# Patient Record
Sex: Female | Born: 1970 | Race: Black or African American | Hispanic: No | Marital: Single | Smoking: Never smoker
Health system: Southern US, Community
[De-identification: ages and names within clinical notes are randomized; demographics above are authoritative.]

## PROBLEM LIST (undated history)

## (undated) DIAGNOSIS — F329 Major depressive disorder, single episode, unspecified: Secondary | ICD-10-CM

## (undated) DIAGNOSIS — F32A Depression, unspecified: Secondary | ICD-10-CM

## (undated) DIAGNOSIS — D219 Benign neoplasm of connective and other soft tissue, unspecified: Secondary | ICD-10-CM

## (undated) HISTORY — DX: Benign neoplasm of connective and other soft tissue, unspecified: D21.9

## (undated) HISTORY — PX: PELVIC LAPAROSCOPY: SHX162

---

## 2003-06-13 ENCOUNTER — Encounter: Admission: RE | Admit: 2003-06-13 | Discharge: 2003-06-13 | Payer: Self-pay | Admitting: Family Medicine

## 2003-06-13 ENCOUNTER — Encounter: Payer: Self-pay | Admitting: Family Medicine

## 2003-09-03 ENCOUNTER — Ambulatory Visit (HOSPITAL_COMMUNITY): Admission: RE | Admit: 2003-09-03 | Discharge: 2003-09-03 | Payer: Self-pay | Admitting: *Deleted

## 2003-09-13 ENCOUNTER — Ambulatory Visit (HOSPITAL_COMMUNITY): Admission: RE | Admit: 2003-09-13 | Discharge: 2003-09-13 | Payer: Self-pay | Admitting: *Deleted

## 2003-09-30 HISTORY — PX: UTERINE ARTERY EMBOLIZATION: SHX2629

## 2003-10-09 ENCOUNTER — Observation Stay (HOSPITAL_COMMUNITY): Admission: RE | Admit: 2003-10-09 | Discharge: 2003-10-10 | Payer: Self-pay | Admitting: Diagnostic Radiology

## 2006-05-03 ENCOUNTER — Other Ambulatory Visit: Admission: RE | Admit: 2006-05-03 | Discharge: 2006-05-03 | Payer: Self-pay | Admitting: Family Medicine

## 2006-10-29 HISTORY — PX: ACHILLES TENDON REPAIR: SUR1153

## 2008-10-30 ENCOUNTER — Other Ambulatory Visit: Admission: RE | Admit: 2008-10-30 | Discharge: 2008-10-30 | Payer: Self-pay | Admitting: Obstetrics and Gynecology

## 2009-09-30 ENCOUNTER — Ambulatory Visit (HOSPITAL_COMMUNITY): Admission: RE | Admit: 2009-09-30 | Discharge: 2009-09-30 | Payer: Self-pay | Admitting: Obstetrics and Gynecology

## 2010-12-19 ENCOUNTER — Encounter: Payer: Self-pay | Admitting: Family Medicine

## 2010-12-19 ENCOUNTER — Encounter: Payer: Self-pay | Admitting: Diagnostic Radiology

## 2010-12-20 ENCOUNTER — Encounter: Payer: Self-pay | Admitting: Diagnostic Radiology

## 2011-01-19 HISTORY — PX: LAPAROSCOPIC ASSISTED VAGINAL HYSTERECTOMY: SHX5398

## 2013-01-05 ENCOUNTER — Emergency Department (HOSPITAL_COMMUNITY): Payer: BC Managed Care – PPO

## 2013-01-05 ENCOUNTER — Emergency Department (HOSPITAL_COMMUNITY)
Admission: EM | Admit: 2013-01-05 | Discharge: 2013-01-05 | Disposition: A | Discharge status: Home / Self Care | Payer: BC Managed Care – PPO | Attending: Emergency Medicine | Admitting: Emergency Medicine

## 2013-01-05 ENCOUNTER — Encounter (HOSPITAL_COMMUNITY): Payer: Self-pay | Admitting: *Deleted

## 2013-01-05 DIAGNOSIS — Z79899 Other long term (current) drug therapy: Secondary | ICD-10-CM | POA: Insufficient documentation

## 2013-01-05 DIAGNOSIS — R109 Unspecified abdominal pain: Secondary | ICD-10-CM

## 2013-01-05 DIAGNOSIS — M545 Low back pain, unspecified: Secondary | ICD-10-CM | POA: Insufficient documentation

## 2013-01-05 DIAGNOSIS — R1031 Right lower quadrant pain: Secondary | ICD-10-CM | POA: Insufficient documentation

## 2013-01-05 DIAGNOSIS — R300 Dysuria: Secondary | ICD-10-CM | POA: Insufficient documentation

## 2013-01-05 DIAGNOSIS — Z90710 Acquired absence of both cervix and uterus: Secondary | ICD-10-CM | POA: Insufficient documentation

## 2013-01-05 DIAGNOSIS — F17200 Nicotine dependence, unspecified, uncomplicated: Secondary | ICD-10-CM | POA: Insufficient documentation

## 2013-01-05 LAB — CBC WITH DIFFERENTIAL/PLATELET
Basophils Absolute: 0 10*3/uL (ref 0.0–0.1)
Basophils Relative: 1 % (ref 0–1)
Eosinophils Absolute: 0.1 10*3/uL (ref 0.0–0.7)
Eosinophils Relative: 1 % (ref 0–5)
HCT: 40.1 % (ref 36.0–46.0)
Hemoglobin: 13.7 g/dL (ref 12.0–15.0)
Lymphocytes Relative: 15 % (ref 12–46)
Lymphs Abs: 1.1 10*3/uL (ref 0.7–4.0)
MCH: 25.6 pg — ABNORMAL LOW (ref 26.0–34.0)
MCHC: 34.2 g/dL (ref 30.0–36.0)
MCV: 75 fL — ABNORMAL LOW (ref 78.0–100.0)
Monocytes Absolute: 0.3 10*3/uL (ref 0.1–1.0)
Monocytes Relative: 4 % (ref 3–12)
Neutro Abs: 5.7 10*3/uL (ref 1.7–7.7)
Neutrophils Relative %: 80 % — ABNORMAL HIGH (ref 43–77)
Platelets: 269 10*3/uL (ref 150–400)
RBC: 5.35 MIL/uL — ABNORMAL HIGH (ref 3.87–5.11)
RDW: 15.6 % — ABNORMAL HIGH (ref 11.5–15.5)
WBC: 7.2 10*3/uL (ref 4.0–10.5)

## 2013-01-05 LAB — COMPREHENSIVE METABOLIC PANEL
ALT: 13 U/L (ref 0–35)
AST: 21 U/L (ref 0–37)
Albumin: 4 g/dL (ref 3.5–5.2)
Alkaline Phosphatase: 58 U/L (ref 39–117)
BUN: 17 mg/dL (ref 6–23)
CO2: 30 mEq/L (ref 19–32)
Calcium: 9.4 mg/dL (ref 8.4–10.5)
Chloride: 99 mEq/L (ref 96–112)
Creatinine, Ser: 0.94 mg/dL (ref 0.50–1.10)
GFR calc Af Amer: 86 mL/min — ABNORMAL LOW (ref >90)
GFR calc non Af Amer: 74 mL/min — ABNORMAL LOW (ref >90)
Glucose, Bld: 103 mg/dL — ABNORMAL HIGH (ref 70–99)
Potassium: 3.9 mEq/L (ref 3.5–5.1)
Sodium: 137 mEq/L (ref 135–145)
Total Bilirubin: 0.2 mg/dL — ABNORMAL LOW (ref 0.3–1.2)
Total Protein: 7.4 g/dL (ref 6.0–8.3)

## 2013-01-05 LAB — URINALYSIS, ROUTINE W REFLEX MICROSCOPIC
Bilirubin Urine: NEGATIVE
Glucose, UA: NEGATIVE mg/dL
Hgb urine dipstick: NEGATIVE
Ketones, ur: NEGATIVE mg/dL
Leukocytes, UA: NEGATIVE
Nitrite: NEGATIVE
Protein, ur: NEGATIVE mg/dL
Specific Gravity, Urine: 1.021 (ref 1.005–1.030)
Urobilinogen, UA: 0.2 mg/dL (ref 0.0–1.0)
pH: 5 (ref 5.0–8.0)

## 2013-01-05 MED ORDER — IOHEXOL 300 MG/ML  SOLN
50.0000 mL | Freq: Once | INTRAMUSCULAR | Status: AC | PRN
  Administered 2013-01-05: 50 mL via ORAL

## 2013-01-05 MED ORDER — IOHEXOL 300 MG/ML  SOLN
100.0000 mL | Freq: Once | INTRAMUSCULAR | Status: AC | PRN
  Administered 2013-01-05: 100 mL via INTRAVENOUS

## 2013-01-05 MED ORDER — NAPROXEN 500 MG PO TABS: 500.0000 mg | ORAL_TABLET | Freq: Two times a day (BID) | ORAL | Status: DC

## 2013-01-05 NOTE — ED Notes (Signed)
Pt returned from CT °

## 2013-01-05 NOTE — ED Notes (Signed)
MD at bedside. 

## 2013-01-05 NOTE — ED Provider Notes (Signed)
History     CSN: [id number]  Arrival date & time 01/05/13  1437   First MD Initiated Contact with Patient 01/05/13 1627      Chief Complaint  Patient presents with  . Abdominal Pain    (Consider location/radiation/quality/duration/timing/severity/associated sxs/prior treatment) HPI Comments: 42 year old female with no significant medical history, status post hysterectomy several years ago who presents with right lower quadrant pain. She states that her abdomen started hurting yesterday, it was gradual in onset, he does have some radiation to her lower back and is not associated with nausea vomiting fevers or chills. She does have mild difficulty urinating and has had fewer bowel movements but denies constipation or blood in the stools. The symptoms have been relatively persistent over the last 24 hours. She does note that she had similar symptoms one week ago which resolved spontaneously. She also denies hematuria  Patient is a 42 y.o. female presenting with abdominal pain. The history is provided by the patient.  Abdominal Pain The primary symptoms of the illness include abdominal pain.    History reviewed. No pertinent past medical history.  Past Surgical History  Procedure Date  . Abdominal hysterectomy   . Achilles tendon repair   . Uterine artery embolization     No family history on file.  History  Substance Use Topics  . Smoking status: Current Every Day Smoker    Types: Cigarettes  . Smokeless tobacco: Not on file  . Alcohol Use: Yes     Comment: occasionally    OB History    Grav Para Term Preterm Abortions TAB SAB Ect Mult Living                  Review of Systems  Gastrointestinal: Positive for abdominal pain.  All other systems reviewed and are negative.    Allergies  Review of patient's allergies indicates no known allergies.  Home Medications   Current Outpatient Rx  Name  Route  Sig  Dispense  Refill  . BUPROPION HCL ER (XL) 300 MG PO TB24    Oral   Take 300 mg by mouth daily.         . OMEGA-3 FATTY ACIDS 1000 MG PO CAPS   Oral   Take 1 g by mouth 3 (three) times daily.         Marland Kitchen NAPROXEN 500 MG PO TABS   Oral   Take 1 tablet (500 mg total) by mouth 2 (two) times daily with a meal.   30 tablet   0     BP 108/62  Pulse 65  Temp 98.3 F (36.8 C) (Oral)  Resp 16  Ht 5\' 2"  (1.575 m)  Wt 145 lb (65.772 kg)  BMI 26.52 kg/m2  SpO2 100%  Physical Exam  Nursing note and vitals reviewed. Constitutional: She appears well-developed and well-nourished. No distress.  HENT:  Head: Normocephalic and atraumatic.  Mouth/Throat: Oropharynx is clear and moist. No oropharyngeal exudate.  Eyes: Conjunctivae normal and EOM are normal. Pupils are equal, round, and reactive to light. Right eye exhibits no discharge. Left eye exhibits no discharge. No scleral icterus.  Neck: Normal range of motion. Neck supple. No JVD present. No thyromegaly present.  Cardiovascular: Normal rate, regular rhythm, normal heart sounds and intact distal pulses.  Exam reveals no gallop and no friction rub.   No murmur heard. Pulmonary/Chest: Effort normal and breath sounds normal. No respiratory distress. She has no wheezes. She has no rales.  Abdominal: Soft. Bowel sounds  are normal. She exhibits no distension and no mass. There is tenderness ( Mild tenderness in the right lower quadrant, no Rovsing sign, no obturator sign, no psoas sign).  Musculoskeletal: Normal range of motion. She exhibits no edema and no tenderness.  Lymphadenopathy:    She has no cervical adenopathy.  Neurological: She is alert. Coordination normal.  Skin: Skin is warm and dry. No rash noted. No erythema.  Psychiatric: She has a normal mood and affect. Her behavior is normal.    ED Course  Procedures (including critical care time)  Labs Reviewed  CBC WITH DIFFERENTIAL - Abnormal; Notable for the following:    RBC 5.35 (*)     MCV 75.0 (*)     MCH 25.6 (*)     RDW 15.6  (*)     Neutrophils Relative 80 (*)     All other components within normal limits  COMPREHENSIVE METABOLIC PANEL - Abnormal; Notable for the following:    Glucose, Bld 103 (*)     Total Bilirubin 0.2 (*)     GFR calc non Af Amer 74 (*)     GFR calc Af Amer 86 (*)     All other components within normal limits  URINALYSIS, ROUTINE W REFLEX MICROSCOPIC   Ct Abdomen Pelvis W Contrast  01/05/2013  *RADIOLOGY REPORT*  Clinical Data: Right lower quadrant abdominal pain, nausea  CT ABDOMEN AND PELVIS WITH CONTRAST  Technique:  Multidetector CT imaging of the abdomen and pelvis was performed following the standard protocol during bolus administration of intravenous contrast.  Contrast: OMNIPAQUE IOHEXOL 300 MG/ML  SOLN  Comparison: None.  Findings: The lung bases are clear.  The liver enhances with two small subcentimeter low attenuation structures in the posterior right lower consistent with benign process such as cysts.  The gallbladder is contracted but no calcified gallstones are seen. The pancreas is normal in size and the pancreatic duct is not dilated.  The kidneys enhance with no calculus or solid mass, and there is a cyst in the mid right kidney of approximately 2.8 cm in diameter.  On delayed images the pelvocaliceal systems are unremarkable in the proximal ureters are normal in caliber.  The abdominal aorta is normal in caliber.  No adenopathy is seen.  The appendix is not optimally visualized, but the portion of the appendix that is visualized appears to fill with air.  No inflammatory process is seen within the right lower quadrant. However, there is a moderate amount of fluid in the cul-de-sac. Most likely this is due to a ruptured ovarian cyst with multiple follicles noted on the right.  A small follicle is noted anteriorly in the left ovary.  The urinary bladder is unremarkable.  The uterus has previously been resected. No acute skeletal abnormality is seen.  IMPRESSION:  1.  The appendix is  not well seen, but there is no evidence of an inflammatory process within the right lower quadrant. 2.  Moderate amount of free fluid within the cul-de-sac most likely due to a recent ruptured ovarian cyst.   Original Report Authenticated By: Dwyane Dee, M.D.      1. Abdominal pain       MDM  The patient has minimal tenderness, she declines pain medications at this time, she has vital signs which are very reassuring. We'll obtain a CT scan to rule out appendicitis thought also consider ovarian cysts or kidney stone in the differential diagnosis. Urinalysis is negative for hematuria making kidney stones less likely.  The urinalysis is also negative for infection.   CT scan shows some fluid in the pelvis, no signs of appendicitis, the patient appears stable and has a soft minimally tender abdomen.  She has been given indications for return and has expressed her understanding. She will be discharged with Naprosyn.  No leukocytosis     Vida Roller, MD 01/05/13 [place]

## 2013-01-05 NOTE — ED Notes (Signed)
Pt c/o some difficulty urinating and fewer bowel movements for the past few days.  Yesterday she noticed RLQ pain that ebbs and flows. Pain is sharp. Pt still has appendix.

## 2013-01-05 NOTE — ED Notes (Signed)
Pt ambulatory back to room.  Pt undressed into gown and ready to be seen by provider

## 2014-01-02 ENCOUNTER — Other Ambulatory Visit: Payer: Self-pay

## 2014-01-02 DIAGNOSIS — Z1231 Encounter for screening mammogram for malignant neoplasm of breast: Secondary | ICD-10-CM

## 2014-01-07 ENCOUNTER — Ambulatory Visit
Admission: RE | Admit: 2014-01-07 | Discharge: 2014-01-07 | Disposition: A | Discharge status: Home / Self Care | Payer: BC Managed Care – PPO | Source: Ambulatory Visit

## 2014-01-07 DIAGNOSIS — Z1231 Encounter for screening mammogram for malignant neoplasm of breast: Secondary | ICD-10-CM

## 2014-01-11 ENCOUNTER — Other Ambulatory Visit: Payer: Self-pay | Admitting: Family Medicine

## 2014-01-11 DIAGNOSIS — R928 Other abnormal and inconclusive findings on diagnostic imaging of breast: Secondary | ICD-10-CM

## 2014-01-30 ENCOUNTER — Ambulatory Visit
Admission: RE | Admit: 2014-01-30 | Discharge: 2014-01-30 | Disposition: A | Discharge status: Home / Self Care | Payer: BC Managed Care – PPO | Source: Ambulatory Visit | Attending: Family Medicine | Admitting: Family Medicine

## 2014-01-30 DIAGNOSIS — R928 Other abnormal and inconclusive findings on diagnostic imaging of breast: Secondary | ICD-10-CM

## 2014-02-27 ENCOUNTER — Other Ambulatory Visit: Payer: Self-pay | Admitting: Family Medicine

## 2014-02-27 ENCOUNTER — Ambulatory Visit
Admission: RE | Admit: 2014-02-27 | Discharge: 2014-02-27 | Disposition: A | Discharge status: Home / Self Care | Payer: BC Managed Care – PPO | Source: Ambulatory Visit | Attending: Family Medicine | Admitting: Family Medicine

## 2014-02-27 DIAGNOSIS — M79673 Pain in unspecified foot: Secondary | ICD-10-CM

## 2014-12-02 ENCOUNTER — Ambulatory Visit (INDEPENDENT_AMBULATORY_CARE_PROVIDER_SITE_OTHER): Payer: BC Managed Care – PPO | Admitting: Nurse Practitioner

## 2014-12-02 ENCOUNTER — Encounter: Payer: Self-pay | Admitting: Nurse Practitioner

## 2014-12-02 VITALS — BP 120/76 | HR 56 | Ht 62.0 in | Wt 148.0 lb

## 2014-12-02 DIAGNOSIS — Z01419 Encounter for gynecological examination (general) (routine) without abnormal findings: Secondary | ICD-10-CM

## 2014-12-02 DIAGNOSIS — Z113 Encounter for screening for infections with a predominantly sexual mode of transmission: Secondary | ICD-10-CM

## 2014-12-02 DIAGNOSIS — Z0000 Encounter for general adult medical examination without abnormal findings: Secondary | ICD-10-CM

## 2014-12-02 DIAGNOSIS — N951 Menopausal and female climacteric states: Secondary | ICD-10-CM

## 2014-12-02 LAB — POCT URINALYSIS DIPSTICK
Bilirubin, UA: NEGATIVE
Blood, UA: NEGATIVE
Glucose, UA: NEGATIVE
Ketones, UA: NEGATIVE
Leukocytes, UA: NEGATIVE
Nitrite, UA: NEGATIVE
Protein, UA: NEGATIVE
Urobilinogen, UA: NEGATIVE
pH, UA: 6

## 2014-12-02 NOTE — Progress Notes (Signed)
Patient ID: Virginia Hull, female   DOB: 1971/09/07, 44 y.o.   MRN: [id number] 43 y.o. G1P0010 Single African American Fe here for NGYN  exam. Former patient here but not seen since 04/2011.  Chart still available.  Not SA 6 X months.  Not tested for STD's since ending relationship at that time. Sleep interruption secondary to hot flashes since school started in August.  Does not feel this is entirely related to stress. Some decrease in libido and vaginal dryness. Recent TSH was done at PCP.  Patient's last menstrual period was 12/30/2010 (approximate).          Sexually active: No.  The current method of family planning is abstinence and status post hysterectomy.    Exercising: Yes.    Home exercise routine includes running. Smoker:  no  Health Maintenance: Pap:  10/30/10, Negative MMG:   01/07/14 with diagnostic MMG and ultrasound, right breast; asymmetrical fibroglandular tissue, repeat in 1 year TDaP:  ? Labs:  HB:  13.9  Urine:  Negative    reports that she has never smoked. She has never used smokeless tobacco. She reports that she drinks about 0.6 oz of alcohol per week. She reports that she does not use illicit drugs.  Past Medical History  Diagnosis Date  . Fibroid     Past Surgical History  Procedure Laterality Date  . Achilles tendon repair Right 10/2006  . Uterine artery embolization  09/2003  . Laparoscopic assisted vaginal hysterectomy  01/19/11    Current Outpatient Prescriptions  Medication Sig Dispense Refill  . buPROPion (WELLBUTRIN XL) 300 MG 24 hr tablet Take 300 mg by mouth daily.    . fish oil-omega-3 fatty acids 1000 MG capsule Take 1 g by mouth 3 (three) times daily.     No current facility-administered medications for this visit.    Family History  Problem Relation Age of Onset  . Hypertension Mother   . Diabetes Father   . Cancer Father 18    pancreatic  . Hypertension Sister   . Cancer Paternal Uncle     rare, unsure type  . Cancer Maternal  Grandmother     ? spinal    ROS:  Pertinent items are noted in HPI.  Otherwise, a comprehensive ROS was negative.  Exam:   BP 120/76 mmHg  Pulse 56  Ht 5\' 2"  (1.575 m)  Wt 148 lb (67.132 kg)  BMI 27.06 kg/m2  LMP 12/30/2010 (Approximate) Height: 5\' 2"  (157.5 cm)  Ht Readings from Last 3 Encounters:  12/02/14 5\' 2"  (1.575 m)  01/05/13 5\' 2"  (1.575 m)    General appearance: alert, cooperative and appears stated age Head: Normocephalic, without obvious abnormality, atraumatic Neck: no adenopathy, supple, symmetrical, trachea midline and thyroid normal to inspection and palpation Lungs: clear to auscultation bilaterally Breasts: normal appearance, no masses or tenderness, no mass Heart: regular rate and rhythm Abdomen: soft, non-tender; no masses,  no organomegaly Extremities: extremities normal, atraumatic, no cyanosis or edema Skin: Skin color, texture, turgor normal. No rashes or lesions Lymph nodes: Cervical, supraclavicular, and axillary nodes normal. No abnormal inguinal nodes palpated Neurologic: Grossly normal   Pelvic: External genitalia:  no lesions              Urethra:  normal appearing urethra with no masses, tenderness or lesions              Bartholin's and Skene's: normal  Vagina: normal appearing vagina with normal color and discharge, no lesions              Cervix: absent              Pap taken: No. Bimanual Exam:  Uterus:  uterus absent              Adnexa: no mass, fullness, tenderness               Rectovaginal: Confirms               Anus:  normal sphincter tone, no lesions  A:  Well Woman with normal exam  Perimenopausal - increase in vaso symptoms  S/P LAVH 12/2010 secondary to fibroids  R/O STD's  History of anxiety and depression on med's followed by PCP  P:   Reviewed health and wellness pertinent to exam  Pap smear not taken today  Mammogram is due 2/16 - history of abnormal area on the right  Follow with labs - discussed use  of ERT but would not start any treatment until newest mammogram is done and is normal.  Will then need a consult visit  Counseled on breast self exam, mammography screening, adequate intake of calcium and vitamin D, diet and exercise return annually or prn  An After Visit Summary was printed and given to the patient.

## 2014-12-02 NOTE — Patient Instructions (Addendum)

## 2014-12-03 LAB — FOLLICLE STIMULATING HORMONE: FSH: 7.4 m[IU]/mL

## 2014-12-03 LAB — STD PANEL
HIV 1&2 Ab, 4th Generation: NONREACTIVE
Hepatitis B Surface Ag: NEGATIVE

## 2014-12-03 LAB — HEMOGLOBIN, FINGERSTICK: Hemoglobin, fingerstick: 13.9 g/dL (ref 12.0–16.0)

## 2014-12-03 NOTE — Progress Notes (Signed)
Encounter reviewed by Dr. Brook Silva.  

## 2014-12-04 LAB — IPS N GONORRHOEA AND CHLAMYDIA BY PCR

## 2014-12-05 ENCOUNTER — Telehealth: Payer: Self-pay | Admitting: *Deleted

## 2014-12-05 NOTE — Telephone Encounter (Signed)
-----   Message from Milford Cage, [place] sent at 12/05/2014  8:41 AM EST ----- Let patient now that [facility] and chlamydia is negative

## 2014-12-05 NOTE — Telephone Encounter (Signed)
I have attempted to contact this patient by phone with the following results: left message to return call to [place] at [phone] on answering machine (mobile per [facility]).  No personal information given.  [phone] (Mobile)

## 2014-12-11 NOTE — Telephone Encounter (Signed)
Return call to Stephanie. °

## 2014-12-12 NOTE — Telephone Encounter (Signed)
I have attempted to contact this patient by phone with the following results: left message to return call to [place] at [phone] on answering machine (mobile per [facility]). No personal information given. [phone] (Mobile)

## 2014-12-19 NOTE — Telephone Encounter (Signed)
Notified pt of lab results.  Pt has questions regarding [place] and hot flashes. Pt states she is having hot flashes and sweating during the day.  Advised pt that last office states ERT may be an option after MMG.  MMG is due after 02/01/15.  Pt would like to know of any suggestions for OTC rememdies.  Please advise.

## 2014-12-23 NOTE — Telephone Encounter (Signed)
Patient is aware of [place] level at 7.4 which is not in menopausal range.  She may take OTC black cohosh an soy for vaso symptoms.  ie: Estroven or Remifemin.   She is S/P [facility] 2012.

## 2014-12-23 NOTE — Telephone Encounter (Signed)
I have attempted to contact this patient by phone with the following results: left message to return call to [place] at [phone] on answering machine (mobile per [facility]). No personal information given. [phone] (Mobile)

## 2015-04-23 NOTE — Telephone Encounter (Signed)
I have attempted to contact this patient by phone with the following results: left message to return call to [place] at [phone] on answering machine (mobile per [facility]). advised call was to follow up recent visit. [phone] (Mobile)

## 2015-04-29 ENCOUNTER — Encounter: Payer: Self-pay | Admitting: *Deleted

## 2015-04-29 NOTE — Telephone Encounter (Signed)
Letter printed and mailed.  

## 2015-04-29 NOTE — Telephone Encounter (Signed)
Would you send her a letter to call back.  We can give her test results and suggestions for menopausal symptoms at her return call.

## 2015-04-29 NOTE — Telephone Encounter (Signed)
I have attempted to call patient with recommendations, but patient has not returned call.  Please advise any further follow up.

## 2015-05-19 NOTE — Telephone Encounter (Signed)
Letter mailed on 04/29/15 with no return call from patient.  Pt was given lab results on 12/19/14, but had additional questions at that time. Please advise further follow up.  Thanks.

## 2015-05-19 NOTE — Telephone Encounter (Signed)
No further follow up is needed and she will call us if any questions of information is needed.

## 2015-05-20 NOTE — Telephone Encounter (Signed)
Closing encounter

## 2015-12-08 ENCOUNTER — Encounter: Payer: Self-pay | Admitting: Nurse Practitioner

## 2015-12-08 ENCOUNTER — Ambulatory Visit (INDEPENDENT_AMBULATORY_CARE_PROVIDER_SITE_OTHER): Payer: BLUE CROSS/BLUE SHIELD | Admitting: Nurse Practitioner

## 2015-12-08 VITALS — BP 110/72 | HR 64 | Ht 62.0 in | Wt 145.0 lb

## 2015-12-08 DIAGNOSIS — Z01419 Encounter for gynecological examination (general) (routine) without abnormal findings: Secondary | ICD-10-CM | POA: Diagnosis not present

## 2015-12-08 DIAGNOSIS — Z0000 Encounter for general adult medical examination without abnormal findings: Secondary | ICD-10-CM | POA: Diagnosis not present

## 2015-12-08 NOTE — Patient Instructions (Addendum)

## 2015-12-08 NOTE — Progress Notes (Signed)
Patient ID: Virginia Hull, female   DOB: 05-Feb-1971, 45 y.o.   MRN: [id number]  45 y.o. G1P0010 Single  African American Fe here for annual exam.  Not dating not SA for 1.5 years. No hot flashes.  Had right OV cyst 01/05/2013 and since then will have a pain on right side every 3-4 months.  This pain is not as bad, does produce bloating, some increase in PMS, and a dull achy pain that is resolved in 2-4 days.  Last episode of the pain was in December.  Patient's last menstrual period was 12/30/2010 (approximate).          Sexually active: No.  The current method of family planning is abstinence.    Exercising: Yes.    running, yoga, weight lifting Smoker:  no  Health Maintenance: Pap: 10/30/10, Negative (LAVH) MMG:  01/07/14 with diagnostic MMG and ultrasound, right breast; asymmetrical fibroglandular tissue, repeat in 1 year TDaP: ?? Will check with PCP Hep C and HIV: Hep C not indicated due to age; HIV completed 12/02/14 Labs: HB: 12.8  Urine: Negative    reports that she has never smoked. She has never used smokeless tobacco. She reports that she drinks about 0.6 oz of alcohol per week. She reports that she does not use illicit drugs.  Past Medical History  Diagnosis Date  . Fibroid     Past Surgical History  Procedure Laterality Date  . Achilles tendon repair Right 10/2006  . Uterine artery embolization  09/2003  . Laparoscopic assisted vaginal hysterectomy  01/19/11    Current Outpatient Prescriptions  Medication Sig Dispense Refill  . buPROPion (WELLBUTRIN XL) 300 MG 24 hr tablet Take 300 mg by mouth daily.    . fish oil-omega-3 fatty acids 1000 MG capsule Take 1 g by mouth 3 (three) times daily.    Marland Kitchen VYVANSE 40 MG capsule Take 40 mg by mouth every morning.  0   No current facility-administered medications for this visit.    Family History  Problem Relation Age of Onset  . Hypertension Mother   . Diabetes Father   . Pancreatic cancer Father 66    pancreatic  . Hypertension  Sister   . Cancer Paternal Uncle     rare, unsure type  . Cancer Maternal Grandmother     ? spinal  . Pancreatic cancer Other     paternal great uncle    ROS:  Pertinent items are noted in HPI.  Otherwise, a comprehensive ROS was negative.  Exam:   BP 110/72 mmHg  Pulse 64  Ht 5\' 2"  (1.575 m)  Wt 145 lb (65.772 kg)  BMI 26.51 kg/m2  LMP 12/30/2010 (Approximate) Height: 5\' 2"  (157.5 cm) Ht Readings from Last 3 Encounters:  12/08/15 5\' 2"  (1.575 m)  12/02/14 5\' 2"  (1.575 m)  01/05/13 5\' 2"  (1.575 m)    General appearance: alert, cooperative and appears stated age Head: Normocephalic, without obvious abnormality, atraumatic Neck: no adenopathy, supple, symmetrical, trachea midline and thyroid normal to inspection and palpation Lungs: clear to auscultation bilaterally Breasts: normal appearance, no masses or tenderness Heart: regular rate and rhythm Abdomen: soft, non-tender; no masses,  no organomegaly Extremities: extremities normal, atraumatic, no cyanosis or edema Skin: Skin color, texture, turgor normal. No rashes or lesions Lymph nodes: Cervical, supraclavicular, and axillary nodes normal. No abnormal inguinal nodes palpated Neurologic: Grossly normal   Pelvic: External genitalia:  no lesions              Urethra:  normal appearing urethra with no masses, tenderness or lesions              Bartholin's and Skene's: normal                 Vagina: normal appearing vagina with normal color and discharge, no lesions              Cervix: absent              Pap taken: No. Bimanual Exam:  Uterus:  uterus absent              Adnexa: no mass, fullness, tenderness               Rectovaginal: Confirms               Anus:  normal sphincter tone, no lesions  Chaperone present: no  A:  Well Woman with normal exam S/P LAVH 12/2010 secondary to fibroids History of anxiety and depression on med's followed by PCP   P:   Reviewed health and wellness  pertinent to exam  Pap smear as above  Mammogram is past due and will schedule - offered several times for [place] to make her apt. She declines.  Counseled on breast self exam, mammography screening, adequate intake of calcium and vitamin D, diet and exercise return annually or prn  An After Visit Summary was printed and given to the patient.

## 2015-12-09 LAB — POCT URINALYSIS DIPSTICK
Bilirubin, UA: NEGATIVE
Glucose, UA: NEGATIVE
KETONES UA: NEGATIVE
Leukocytes, UA: NEGATIVE
Nitrite, UA: NEGATIVE
PH UA: 6
PROTEIN UA: NEGATIVE
RBC UA: NEGATIVE
Urobilinogen, UA: NEGATIVE

## 2015-12-09 NOTE — Progress Notes (Signed)
Encounter reviewed Virginia Polinski, MD   

## 2015-12-15 LAB — HEMOGLOBIN, FINGERSTICK: Hemoglobin, fingerstick: 12.8 g/dL (ref 12.0–16.0)

## 2016-05-19 ENCOUNTER — Other Ambulatory Visit: Payer: Self-pay | Admitting: Nurse Practitioner

## 2016-05-19 ENCOUNTER — Other Ambulatory Visit: Payer: Self-pay | Admitting: Obstetrics & Gynecology

## 2016-05-19 DIAGNOSIS — Z1231 Encounter for screening mammogram for malignant neoplasm of breast: Secondary | ICD-10-CM

## 2016-06-02 ENCOUNTER — Ambulatory Visit
Admission: RE | Admit: 2016-06-02 | Discharge: 2016-06-02 | Disposition: A | Discharge status: Home / Self Care | Payer: BLUE CROSS/BLUE SHIELD | Source: Ambulatory Visit | Attending: Nurse Practitioner | Admitting: Nurse Practitioner

## 2016-06-02 DIAGNOSIS — Z1231 Encounter for screening mammogram for malignant neoplasm of breast: Secondary | ICD-10-CM

## 2016-07-21 ENCOUNTER — Encounter: Payer: Self-pay | Admitting: Obstetrics and Gynecology

## 2016-07-21 ENCOUNTER — Ambulatory Visit (INDEPENDENT_AMBULATORY_CARE_PROVIDER_SITE_OTHER): Payer: BLUE CROSS/BLUE SHIELD | Admitting: Obstetrics and Gynecology

## 2016-07-21 ENCOUNTER — Telehealth: Payer: Self-pay | Admitting: Nurse Practitioner

## 2016-07-21 VITALS — BP 100/70 | HR 60 | Temp 98.2°F | Resp 16 | Ht 62.0 in | Wt 143.0 lb

## 2016-07-21 DIAGNOSIS — R102 Pelvic and perineal pain: Secondary | ICD-10-CM

## 2016-07-21 DIAGNOSIS — N9489 Other specified conditions associated with female genital organs and menstrual cycle: Secondary | ICD-10-CM

## 2016-07-21 DIAGNOSIS — N949 Unspecified condition associated with female genital organs and menstrual cycle: Secondary | ICD-10-CM | POA: Diagnosis not present

## 2016-07-21 LAB — POCT URINALYSIS DIPSTICK
Bilirubin, UA: NEGATIVE
GLUCOSE UA: NEGATIVE
Ketones, UA: NEGATIVE
Leukocytes, UA: NEGATIVE
NITRITE UA: NEGATIVE
PROTEIN UA: NEGATIVE
RBC UA: NEGATIVE
UROBILINOGEN UA: NEGATIVE
pH, UA: 5

## 2016-07-21 NOTE — Telephone Encounter (Signed)
Spoke with patient. Patient states that for 3 weeks she has been experiencing constant dull pain with intermittent sharp shooting pain in her right pelvic region. States she woke up this morning and pain had worsened. Took Aleve at 4 am without relief. Unable to rate pain on a scale of 1-10. States she does not feel she needs to be seen in the ER. Denies any fever or chills. States she feels "drained and full." Patient has had a hysterectomy, but ovaries remain. Denies vaginal bleeding. Reports history of ovarian cyst. Advised she will need to be seen in the office for further evaluation. She is agreeable. Appointment scheduled for today 07/21/2016 at 4 pm with Dr.Jertson. She is agreeable to date and time.  Routing to provider for final review. Patient agreeable to disposition. Will close encounter.

## 2016-07-21 NOTE — Progress Notes (Signed)
GYNECOLOGY  VISIT   HPI: 45 y.o.   Single  African American  female   G1P0010 with Patient's last menstrual period was 12/30/2010 (approximate).   here for RLQ pain x 3-4 weeks. It is a constant dull pain, with intermittent sharp shooting components. Worse since this morning, woke her up. Today even the sharp pain has been constant. Aleve helps some. No fevers. Over the past month she has felt PMS symptoms, retaining water, swollen breast, night sweats. No nausea, emesis. She has slight constipation. No urinary c/o.  H/O LAVH, still has her ovaries Not sexually active.   GYNECOLOGIC HISTORY: Patient's last menstrual period was 12/30/2010 (approximate). Contraception: Hysterectomy  Menopausal hormone therapy: None        OB History    Gravida Para Term Preterm AB Living   1 0     1 0   SAB TAB Ectopic Multiple Live Births   1                 There are no active problems to display for this patient.   Past Medical History:  Diagnosis Date  . Fibroid     Past Surgical History:  Procedure Laterality Date  . ACHILLES TENDON REPAIR Right 10/2006  . LAPAROSCOPIC ASSISTED VAGINAL HYSTERECTOMY  01/19/11  . UTERINE ARTERY EMBOLIZATION  09/2003    Current Outpatient Prescriptions  Medication Sig Dispense Refill  . buPROPion (WELLBUTRIN XL) 300 MG 24 hr tablet Take 300 mg by mouth daily.    . fish oil-omega-3 fatty acids 1000 MG capsule Take 1 g by mouth 3 (three) times daily.    Marland Kitchen VYVANSE 60 MG capsule Take 1 capsule by mouth daily.  0   No current facility-administered medications for this visit.      ALLERGIES: Review of patient's allergies indicates no known allergies.  Family History  Problem Relation Age of Onset  . Hypertension Mother   . Diabetes Father   . Pancreatic cancer Father 19    pancreatic  . Hypertension Sister   . Cancer Paternal Uncle     rare, unsure type  . Cancer Maternal Grandmother     ? spinal  . Pancreatic cancer Other     paternal great uncle     Social History   Social History  . Marital status: Single    Spouse name: N/A  . Number of children: 0  . Years of education: N/A   Occupational History  . Not on file.   Social History Main Topics  . Smoking status: Never Smoker  . Smokeless tobacco: Never Used  . Alcohol use 0.6 oz/week    1 Standard drinks or equivalent per week     Comment: occasionally  . Drug use: No  . Sexual activity: Not Currently    Partners: Male    Birth control/ protection: Surgical   Other Topics Concern  . Not on file   Social History Narrative  . No narrative on file    Review of Systems  Constitutional: Negative.   HENT: Negative.   Eyes: Negative.   Respiratory: Negative.   Cardiovascular: Negative.   Gastrointestinal: Negative.   Genitourinary: Negative.   Musculoskeletal: Negative.   Skin: Negative.   Neurological: Negative.   Endo/Heme/Allergies: Negative.   Psychiatric/Behavioral: Negative.     PHYSICAL EXAMINATION:    BP 100/70 (BP Location: Right Arm, Patient Position: Sitting, Cuff Size: Normal)   Pulse 60   Temp 98.2 F (36.8 C) (Oral)   Resp 16  Ht 5\' 2"  (1.575 m)   Wt 143 lb (64.9 kg)   LMP 12/30/2010 (Approximate)   BMI 26.16 kg/m     General appearance: alert, cooperative and appears stated age Neck: no adenopathy, supple, symmetrical, trachea midline and thyroid normal to inspection and palpation  Abdomen: soft, mildly tender in the RLQ, no rebound, no guarding. Most tender in the area of her ascending colon.   Pelvic: External genitalia:  no lesions              Urethra:  normal appearing urethra with no masses, tenderness or lesions              Bartholins and Skenes: normal                 Vagina: normal appearing vagina with normal color and discharge, no lesions              Cervix: absent              Bimanual Exam:  Uterus:  uterus absent              Adnexa: tender, irregularly shaped mass at the vaginal apex. Very tender.                Rectovaginal: Yes.  .  Confirms.              Anus:  normal sphincter tone, no lesions  Chaperone was present for exam.  ASSESSMENT Pelvic pain Adnexal mass, tender H/O LAVH    PLAN Aleve and tylenol for pain She will return tomorrow for a gyn ultrasound, declines going to the ER If her pain worsens she needs to go to the ER   An After Visit Summary was printed and given to the patient.

## 2016-07-21 NOTE — Telephone Encounter (Signed)
Patient thinks she may have an ovarian cyst.

## 2016-07-22 ENCOUNTER — Other Ambulatory Visit: Payer: BLUE CROSS/BLUE SHIELD | Admitting: Obstetrics and Gynecology

## 2016-07-22 ENCOUNTER — Ambulatory Visit: Payer: BLUE CROSS/BLUE SHIELD

## 2016-07-22 DIAGNOSIS — R102 Pelvic and perineal pain unspecified side: Secondary | ICD-10-CM

## 2016-07-22 DIAGNOSIS — N9489 Other specified conditions associated with female genital organs and menstrual cycle: Secondary | ICD-10-CM

## 2016-07-28 ENCOUNTER — Ambulatory Visit (INDEPENDENT_AMBULATORY_CARE_PROVIDER_SITE_OTHER): Payer: BLUE CROSS/BLUE SHIELD | Admitting: Obstetrics and Gynecology

## 2016-07-28 ENCOUNTER — Encounter: Payer: Self-pay | Admitting: Obstetrics and Gynecology

## 2016-07-28 VITALS — BP 108/70 | HR 64 | Resp 12 | Wt 145.0 lb

## 2016-07-28 DIAGNOSIS — N949 Unspecified condition associated with female genital organs and menstrual cycle: Secondary | ICD-10-CM

## 2016-07-28 DIAGNOSIS — R102 Pelvic and perineal pain: Secondary | ICD-10-CM

## 2016-07-28 DIAGNOSIS — N9489 Other specified conditions associated with female genital organs and menstrual cycle: Secondary | ICD-10-CM

## 2016-07-28 NOTE — Progress Notes (Signed)
GYNECOLOGY  VISIT   HPI: 45 y.o.   Single  African American  female   G1P0010 with Patient's last menstrual period was 12/30/2010 (approximate).   here for follow up pelvic pain/ U/S results. She was seen last week with pelvic pain and was noted to have a tender mass at her vaginal cuff (history of hysterectomy). At the time of that visit she had a 3-4 weeks history of pain, worse the day of her visit. The pain has lessened since her last visit. She hasn't been sexually active. No history of endometriosis.   GYNECOLOGIC HISTORY: Patient's last menstrual period was 12/30/2010 (approximate). Contraception:hysterectomy  Menopausal hormone therapy: none         OB History    Gravida Para Term Preterm AB Living   1 0     1 0   SAB TAB Ectopic Multiple Live Births   1                 There are no active problems to display for this patient.   Past Medical History:  Diagnosis Date  . Fibroid     Past Surgical History:  Procedure Laterality Date  . ACHILLES TENDON REPAIR Right 10/2006  . LAPAROSCOPIC ASSISTED VAGINAL HYSTERECTOMY  01/19/11  . UTERINE ARTERY EMBOLIZATION  09/2003    Current Outpatient Prescriptions  Medication Sig Dispense Refill  . buPROPion (WELLBUTRIN XL) 300 MG 24 hr tablet Take 300 mg by mouth daily.    . fish oil-omega-3 fatty acids 1000 MG capsule Take 1 g by mouth 3 (three) times daily.    Marland Kitchen VYVANSE 60 MG capsule Take 1 capsule by mouth daily.  0   No current facility-administered medications for this visit.      ALLERGIES: Review of patient's allergies indicates no known allergies.  Family History  Problem Relation Age of Onset  . Hypertension Mother   . Diabetes Father   . Pancreatic cancer Father 19    pancreatic  . Hypertension Sister   . Cancer Paternal Uncle     rare, unsure type  . Cancer Maternal Grandmother     ? spinal  . Pancreatic cancer Other     paternal great uncle    Social History   Social History  . Marital status:  Single    Spouse name: N/A  . Number of children: 0  . Years of education: N/A   Occupational History  . Not on file.   Social History Main Topics  . Smoking status: Never Smoker  . Smokeless tobacco: Never Used  . Alcohol use 0.6 oz/week    1 Standard drinks or equivalent per week     Comment: occasionally  . Drug use: No  . Sexual activity: Not Currently    Partners: Male    Birth control/ protection: Surgical   Other Topics Concern  . Not on file   Social History Narrative  . No narrative on file    Review of Systems  Constitutional: Negative.   HENT: Negative.   Eyes: Negative.   Respiratory: Negative.   Cardiovascular: Negative.   Gastrointestinal: Negative.   Genitourinary:       Mild Pelvic pain  Musculoskeletal: Negative.   Skin: Negative.   Neurological: Negative.   Endo/Heme/Allergies: Negative.   Psychiatric/Behavioral: Negative.     PHYSICAL EXAMINATION:    BP 108/70 (BP Location: Right Arm, Patient Position: Sitting, Cuff Size: Normal)   Pulse 64   Resp 12   Wt 145 lb (65.8  kg)   LMP 12/30/2010 (Approximate)   BMI 26.52 kg/m     General appearance: alert, cooperative and appears stated age  Ultrasound images reviewed with the patient. Uterus is absent, left ovary is normal. In the right adnexa she has a 2.8 x 3.4 cm complex cystic mass. Avascular with internal echos, suspect hemorrhagic CL or endometrioma. There is an additional 3 x 1.2 cm cystic tubular structure suspicious for a hematosalpinx  ASSESSMENT 1 month h/o pelvic pain, starting to improve. Imaging with complex cystic mass on the right, c/w hemorrhagic cyst, possible endometrioma and possible hematosalpinx   PLAN F/U imaging in 8 weeks Monitor pain Depending on results will discuss further plan. Will also do an exam at her f/u visit.   An After Visit Summary was printed and given to the patient.

## 2016-08-03 ENCOUNTER — Telehealth: Payer: Self-pay | Admitting: Obstetrics and Gynecology

## 2016-08-03 NOTE — Telephone Encounter (Signed)
Called patient to review benefits for a recommended procedure. Left Voicemail requesting a call back. °

## 2016-08-10 NOTE — Telephone Encounter (Signed)
Called patient to review benefits for a recommended procedure. Left Voicemail requesting a call back. °

## 2016-08-12 NOTE — Telephone Encounter (Signed)
Called patient to review benefits for a recommended procedure. Left Voicemail requesting a call back. °

## 2016-08-23 NOTE — Telephone Encounter (Signed)
Called patient to review benefits for a recommended procedure. Left Voicemail requesting a call back. °

## 2016-09-06 ENCOUNTER — Telehealth: Payer: Self-pay | Admitting: Obstetrics and Gynecology

## 2016-09-06 NOTE — Telephone Encounter (Signed)
Called patient to review benefits for a recommended procedure. Left Voicemail requesting a call back. °

## 2016-11-02 ENCOUNTER — Encounter: Payer: Self-pay | Admitting: *Deleted

## 2016-12-13 ENCOUNTER — Ambulatory Visit (INDEPENDENT_AMBULATORY_CARE_PROVIDER_SITE_OTHER): Payer: BLUE CROSS/BLUE SHIELD | Admitting: Nurse Practitioner

## 2016-12-13 ENCOUNTER — Encounter: Payer: Self-pay | Admitting: Nurse Practitioner

## 2016-12-13 VITALS — BP 120/80 | HR 62 | Resp 12 | Ht 61.75 in | Wt 145.8 lb

## 2016-12-13 DIAGNOSIS — Z113 Encounter for screening for infections with a predominantly sexual mode of transmission: Secondary | ICD-10-CM | POA: Diagnosis not present

## 2016-12-13 DIAGNOSIS — N83291 Other ovarian cyst, right side: Secondary | ICD-10-CM

## 2016-12-13 DIAGNOSIS — Z0000 Encounter for general adult medical examination without abnormal findings: Secondary | ICD-10-CM | POA: Diagnosis not present

## 2016-12-13 DIAGNOSIS — Z01419 Encounter for gynecological examination (general) (routine) without abnormal findings: Secondary | ICD-10-CM

## 2016-12-13 LAB — LIPID PANEL
CHOL/HDL RATIO: 2 ratio (ref <5.0)
CHOLESTEROL: 205 mg/dL — AB (ref <200)
HDL: 105 mg/dL (ref >50)
LDL CALC: 85 mg/dL (ref <100)
TRIGLYCERIDES: 76 mg/dL (ref <150)
VLDL: 15 mg/dL (ref <30)

## 2016-12-13 LAB — COMPREHENSIVE METABOLIC PANEL
ALK PHOS: 46 U/L (ref 33–115)
ALT: 14 U/L (ref 6–29)
AST: 17 U/L (ref 10–35)
Albumin: 4.2 g/dL (ref 3.6–5.1)
BILIRUBIN TOTAL: 0.3 mg/dL (ref 0.2–1.2)
BUN: 11 mg/dL (ref 7–25)
CALCIUM: 9 mg/dL (ref 8.6–10.2)
CO2: 25 mmol/L (ref 20–31)
CREATININE: 1.05 mg/dL (ref 0.50–1.10)
Chloride: 104 mmol/L (ref 98–110)
Glucose, Bld: 82 mg/dL (ref 65–99)
Potassium: 4.5 mmol/L (ref 3.5–5.3)
SODIUM: 137 mmol/L (ref 135–146)
TOTAL PROTEIN: 6.9 g/dL (ref 6.1–8.1)

## 2016-12-13 LAB — POCT URINALYSIS DIPSTICK
Bilirubin, UA: NEGATIVE
GLUCOSE UA: NEGATIVE
Ketones, UA: NEGATIVE
Leukocytes, UA: NEGATIVE
NITRITE UA: NEGATIVE
PH UA: 5
Protein, UA: NEGATIVE
RBC UA: NEGATIVE
UROBILINOGEN UA: NEGATIVE

## 2016-12-13 LAB — CBC
HCT: 41.9 % (ref 35.0–45.0)
Hemoglobin: 13.4 g/dL (ref 11.7–15.5)
MCH: 24.7 pg — ABNORMAL LOW (ref 27.0–33.0)
MCHC: 32 g/dL (ref 32.0–36.0)
MCV: 77.2 fL — AB (ref 80.0–100.0)
MPV: 11.4 fL (ref 7.5–12.5)
PLATELETS: 276 10*3/uL (ref 140–400)
RBC: 5.43 MIL/uL — AB (ref 3.80–5.10)
RDW: 16.3 % — AB (ref 11.0–15.0)
WBC: 5.2 10*3/uL (ref 3.8–10.8)

## 2016-12-13 LAB — TSH: TSH: 0.53 mIU/L

## 2016-12-13 NOTE — Progress Notes (Signed)
Patient ID: Virginia Hull, female   DOB: 10-31-1971, 46 y.o.   MRN: [id number]  46 y.o. G1P0010 Single  African American Fe here for annual exam.  She has a history of a complex cyst right OV and has not been back for a recheck.  She is having less pain on the right and hopes it has resolved.  We would still like an [place] to confirm this.  She is now OK to schedule. She is SA but not often  - on and of relationship.    Patient's last menstrual period was 12/30/2010 (approximate).          Sexually active: Yes.    The current method of family planning is status post hysterectomy.    Exercising: Yes.    Running, walking, weight training, yoga Smoker:  no  Health Maintenance: Pap: 10/30/10, Negative (LAVH) MMG: 06/02/16, Bi-Rads 1: Negative   TDaP: ?? HIV: 12/02/14 Labs: No blood drawn today. Urine: Negative   reports that she has never smoked. She has never used smokeless tobacco. She reports that she drinks about 1.2 oz of alcohol per week . She reports that she does not use drugs.  Past Medical History:  Diagnosis Date  . Fibroid     Past Surgical History:  Procedure Laterality Date  . ACHILLES TENDON REPAIR Right 10/2006  . LAPAROSCOPIC ASSISTED VAGINAL HYSTERECTOMY  01/19/11  . UTERINE ARTERY EMBOLIZATION  09/2003    Current Outpatient Prescriptions  Medication Sig Dispense Refill  . buPROPion (WELLBUTRIN XL) 300 MG 24 hr tablet Take 300 mg by mouth daily.    . fish oil-omega-3 fatty acids 1000 MG capsule Take 1 g by mouth 3 (three) times daily.    Marland Kitchen VYVANSE 60 MG capsule Take 1 capsule by mouth daily.  0   No current facility-administered medications for this visit.     Family History  Problem Relation Age of Onset  . Hypertension Mother   . Diabetes Father   . Pancreatic cancer Father 9    pancreatic  . Hypertension Sister   . Cancer Paternal Uncle     rare, unsure type  . Cancer Maternal Grandmother     ? spinal  . Pancreatic cancer Other     paternal great uncle     ROS:  Pertinent items are noted in HPI.  Otherwise, a comprehensive ROS was negative.  Exam:   BP 120/80 (BP Location: Right Arm, Patient Position: Sitting, Cuff Size: Normal)   Pulse 62   Resp 12   Ht 5' 1.75" (1.568 m)   Wt 145 lb 12.8 oz (66.1 kg)   LMP 12/30/2010 (Approximate)   BMI 26.88 kg/m  Height: 5' 1.75" (156.8 cm) Ht Readings from Last 3 Encounters:  12/13/16 5' 1.75" (1.568 m)  07/21/16 5\' 2"  (1.575 m)  12/08/15 5\' 2"  (1.575 m)    General appearance: alert, cooperative and appears stated age Head: Normocephalic, without obvious abnormality, atraumatic Neck: no adenopathy, supple, symmetrical, trachea midline and thyroid normal to inspection and palpation Lungs: clear to auscultation bilaterally Breasts: normal appearance, no masses or tenderness Heart: regular rate and rhythm Abdomen: soft, non-tender; no masses,  no organomegaly Extremities: extremities normal, atraumatic, no cyanosis or edema Skin: Skin color, texture, turgor normal. No rashes or lesions Lymph nodes: Cervical, supraclavicular, and axillary nodes normal. No abnormal inguinal nodes palpated Neurologic: Grossly normal   Pelvic: External genitalia:  no lesions              Urethra:  normal appearing urethra with no masses, tenderness or lesions              Bartholin's and Skene's: normal                 Vagina: normal appearing vagina with normal color and discharge, no lesions              Cervix: absent              Pap taken: No. Bimanual Exam:  Uterus:  uterus absent              Adnexa: no mass, fullness, tenderness               Rectovaginal: Confirms               Anus:  normal sphincter tone, no lesions  Chaperone present: yes  A:  Well Woman with normal exam  S/P LAVH 12/2010 secondary to fibroids History of anxiety and depression on med's followed by PCP  History of stress incontinence   P:   Reviewed health and wellness pertinent to exam  Pap smear as  above  Mammogram is due 05/2017  Will get labs today and follow  Will reschedule PUS and follow  Counseled on breast self exam, mammography screening, adequate intake of calcium and vitamin D, diet and exercise, Kegel's exercises return annually or prn  An After Visit Summary was printed and given to the patient.

## 2016-12-13 NOTE — Patient Instructions (Addendum)

## 2016-12-14 ENCOUNTER — Telehealth: Payer: Self-pay | Admitting: *Deleted

## 2016-12-14 LAB — STD PANEL
HIV 1&2 Ab, 4th Generation: NONREACTIVE
Hepatitis B Surface Ag: NEGATIVE

## 2016-12-14 LAB — GC/CHLAMYDIA PROBE AMP
CT PROBE, AMP APTIMA: NOT DETECTED
GC Probe RNA: NOT DETECTED

## 2016-12-14 LAB — CA 125: CA 125: 3 U/mL (ref <35)

## 2016-12-14 LAB — VITAMIN D 25 HYDROXY (VIT D DEFICIENCY, FRACTURES): Vit D, 25-Hydroxy: 33 ng/mL (ref 30–100)

## 2016-12-14 NOTE — Telephone Encounter (Signed)
I have attempted to contact this patient by phone with the following results: left message to return call to [name] at [phone] on answering machine (mobile per [facility]).  Advised call was regarding recent labs.  [phone] (Mobile) *Preferred*

## 2016-12-14 NOTE — Telephone Encounter (Signed)
-----   Message from [place], [place] sent at 12/14/2016 10:25 AM EST ----- Please let pt know that STD panel with HIV, Hep B, STS, GC and Chlamydia is all negative.  The Vit D was 33 but our goal is to get her to 48. Have her to take OTC Vit D 1000 IU daily.  The CA 125 for OV disease was normal at 3.  The TSH, CMP, CBC was all normal. The total cholesterol was slight up at 205.  Lets proceed with PUS as discussed.

## 2016-12-16 NOTE — Progress Notes (Signed)
Encounter reviewed by Dr. Veryl Winemiller Amundson C. Silva.  

## 2016-12-17 NOTE — Telephone Encounter (Signed)
Pt notified in result note.  Closing encounter. 

## 2016-12-20 ENCOUNTER — Telehealth: Payer: Self-pay | Admitting: Obstetrics and Gynecology

## 2016-12-20 DIAGNOSIS — N83209 Unspecified ovarian cyst, unspecified side: Secondary | ICD-10-CM

## 2016-12-20 NOTE — Telephone Encounter (Signed)
Called patient to review benefits for a recommended procedure. Left Voicemail requesting a call back. °

## 2016-12-21 NOTE — Telephone Encounter (Signed)
Called patient to review benefits for a recommended ultrasound. Left Voicemail requesting a call back. °

## 2016-12-23 NOTE — Telephone Encounter (Signed)
Virginia Hull returned call. Conveyed to Virginia Hull benefit for an ultrasound. Virginia Hull understood and agreeable. Virginia Hull ready to schedule. Virginia Hull scheduled 12/30/16 with Dr Talbert Nan. Virginia Hull is aware of date, arrival date and cancellation policy.  No further questions.   Routing to Dr Talbert Nan  cc: Lamont Snowball

## 2016-12-24 NOTE — Telephone Encounter (Signed)
Follow-up ultrasound from 07-28-16. Patient previously declined to schedule.   Routing to provider for final review. Patient agreeable to disposition. Will close encounter.

## 2016-12-30 ENCOUNTER — Encounter: Payer: Self-pay | Admitting: Obstetrics and Gynecology

## 2016-12-30 ENCOUNTER — Ambulatory Visit (INDEPENDENT_AMBULATORY_CARE_PROVIDER_SITE_OTHER): Payer: BLUE CROSS/BLUE SHIELD

## 2016-12-30 ENCOUNTER — Ambulatory Visit (INDEPENDENT_AMBULATORY_CARE_PROVIDER_SITE_OTHER): Payer: BLUE CROSS/BLUE SHIELD | Admitting: Obstetrics and Gynecology

## 2016-12-30 VITALS — BP 122/70 | HR 60 | Resp 14 | Wt 145.0 lb

## 2016-12-30 DIAGNOSIS — N949 Unspecified condition associated with female genital organs and menstrual cycle: Secondary | ICD-10-CM | POA: Diagnosis not present

## 2016-12-30 DIAGNOSIS — N941 Unspecified dyspareunia: Secondary | ICD-10-CM

## 2016-12-30 DIAGNOSIS — Z90710 Acquired absence of both cervix and uterus: Secondary | ICD-10-CM | POA: Diagnosis not present

## 2016-12-30 DIAGNOSIS — N83209 Unspecified ovarian cyst, unspecified side: Secondary | ICD-10-CM | POA: Diagnosis not present

## 2016-12-30 DIAGNOSIS — N9489 Other specified conditions associated with female genital organs and menstrual cycle: Secondary | ICD-10-CM

## 2016-12-30 NOTE — Progress Notes (Signed)
GYNECOLOGY  VISIT   HPI: 46 y.o.   Single  African American  female   G1P0010 with Patient's last menstrual period was 12/30/2010 (approximate).   here for pelvic US   She was seen here last summer with a 3-4 week h/o RLQ abdominal pain. On exam she had a tender mass at her vaginal cuff (prior hysterectomy). Ultrasound at that time showed a normal left adnexa and a 2.8 x 3.4 multicystic mass in the right adnexa, ? Hemorrhagic cyst, possible endometrioma and possible hematosalpinx. She didn't come back for f/u imaging until now.  She is now sexually active and she is c/o intermittent deep dyspareunia. She doesn't c/o significant RLQ abdominal pain at this time.  The patient has a MRI last summer of her right lower back for pain, wondering if they looked at her ovary. Since the summer she has been having bad lower back pain. She has issues with severe right lower back pain. Can hurt to move.  No hot flashes, no night sweats.    GYNECOLOGIC HISTORY: Patient's last menstrual period was 12/30/2010 (approximate). Contraception: hysterectomy  Menopausal hormone therapy: None        OB History    Gravida Para Term Preterm AB Living   1 0 0 0 1 0   SAB TAB Ectopic Multiple Live Births   1 0 0 0 0         There are no active problems to display for this patient.   Past Medical History:  Diagnosis Date  . Fibroid     Past Surgical History:  Procedure Laterality Date  . ACHILLES TENDON REPAIR Right 10/2006  . LAPAROSCOPIC ASSISTED VAGINAL HYSTERECTOMY  01/19/11  . UTERINE ARTERY EMBOLIZATION  09/2003    Current Outpatient Prescriptions  Medication Sig Dispense Refill  . buPROPion (WELLBUTRIN XL) 300 MG 24 hr tablet Take 300 mg by mouth daily.    . fish oil-omega-3 fatty acids 1000 MG capsule Take 1 g by mouth 3 (three) times daily.    Marland Kitchen VYVANSE 60 MG capsule Take 1 capsule by mouth daily.  0   No current facility-administered medications for this visit.      ALLERGIES: Patient  has no known allergies.  Family History  Problem Relation Age of Onset  . Hypertension Mother   . Diabetes Father   . Pancreatic cancer Father 45    pancreatic  . Hypertension Sister   . Cancer Paternal Uncle     rare, unsure type  . Cancer Maternal Grandmother     ? spinal  . Pancreatic cancer Other     paternal great uncle    Social History   Social History  . Marital status: Single    Spouse name: N/A  . Number of children: 0  . Years of education: N/A   Occupational History  . Not on file.   Social History Main Topics  . Smoking status: Never Smoker  . Smokeless tobacco: Never Used  . Alcohol use 1.2 oz/week    2 Standard drinks or equivalent per week     Comment: occasionally  . Drug use: No  . Sexual activity: Yes    Partners: Male    Birth control/ protection: Surgical   Other Topics Concern  . Not on file   Social History Narrative  . No narrative on file    Review of Systems  Constitutional: Negative.   HENT: Negative.   Eyes: Negative.   Respiratory: Negative.   Cardiovascular: Negative.  Gastrointestinal: Negative.   Genitourinary: Negative.   Musculoskeletal: Negative.   Skin: Negative.   Neurological: Negative.   Endo/Heme/Allergies: Negative.   Psychiatric/Behavioral: Negative.     PHYSICAL EXAMINATION:    BP 122/70 (BP Location: Right Arm, Patient Position: Sitting, Cuff Size: Normal)   Pulse 60   Resp 14   Wt 145 lb (65.8 kg)   LMP 12/30/2010 (Approximate)   BMI 26.74 kg/m     General appearance: alert, cooperative and appears stated age Abdomen: soft, mildly tender in the RLQ, no rebound, no guarding,  no masses,  no organomegaly  Pelvic: External genitalia:  no lesions              Urethra:  normal appearing urethra with no masses, tenderness or lesions              Bartholins and Skenes: normal                 Cervix: absent              Bimanual Exam:  Uterus absent. She has a tender irregularly shaped mass at her  vaginal cuff              Rectovaginal: Yes.  .  Confirms.              Anus:  normal sphincter tone, no lesions  Chaperone was present for exam.  Ultrasound: images reviewed with the patient She has a normal left adnexa with a simple appearing follicular cyst. She again has a complex right adnexal mass 3.2 x 2.5 cm which may be a hematosalpinx (not clear) and a separate 1.8 x 1.9 cm solid mass that is avascular. The total right adnexa measures 4.71 x 3.55 x 3.54 cm. + small amount of fluid in the cul de sac  ASSESSMENT Complex right adnexal mass, persistent, more atypical appearing. Mass tender on exam Dyspareunia I believe the patient is premenopausal, she has a follicular appearing cyst on the left    PLAN Return for a CA 125 and [place] Will get a copy of her MRI from the summer to see if the adnexa was identified Discussed Laparoscopy with RSO, possible right salpingectomy and right ovarian cystectomy, left salpingectomy, possible cystoscopy. Discussed the risks of infection, bleeding, damage to bowel/baldder/ureters or vessels Will further discuss after her labs are reviewed. If she declines laparoscopy, would at minimum want to get a MRI (will look at the other report first)   An After Visit Summary was printed and given to the patient.  20 minutes face to face time of which over 50% was spent in counseling.   CC; Edman Circle, NP

## 2017-01-03 ENCOUNTER — Other Ambulatory Visit: Payer: BLUE CROSS/BLUE SHIELD

## 2017-01-03 DIAGNOSIS — N949 Unspecified condition associated with female genital organs and menstrual cycle: Secondary | ICD-10-CM

## 2017-01-03 DIAGNOSIS — Z90710 Acquired absence of both cervix and uterus: Secondary | ICD-10-CM

## 2017-01-03 DIAGNOSIS — N9489 Other specified conditions associated with female genital organs and menstrual cycle: Secondary | ICD-10-CM

## 2017-01-04 LAB — CA 125: CA 125: 3 U/mL (ref <35)

## 2017-01-04 LAB — FOLLICLE STIMULATING HORMONE: FSH: 5.2 m[IU]/mL

## 2017-01-05 ENCOUNTER — Telehealth: Payer: Self-pay | Admitting: Obstetrics and Gynecology

## 2017-01-05 NOTE — Telephone Encounter (Signed)
Spoke with patient regarding benefit for surgical procedure. Patient understood and agreeable. Patient to contact the [facility], after contacting the [place], patient will call back to confirm and proceed with scheduling. Forwarding to Conservation officer, historic buildings.  Routing to [employer]

## 2017-01-06 NOTE — Telephone Encounter (Signed)
Patient called back to confirm she is ready to proceed with scheduling recommended surgical procedure. Forwarding to the Nurse Supervisor for scheduling.  Routing to [employer]

## 2017-01-07 NOTE — Telephone Encounter (Signed)
Patient returned call. Desires to proceed with surgery on 01-24-17. Brief review of post op restrictions. Questions about estimated time out of work and driving. 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 Advised should anticipate 1-2 weeks out of work but with job responsibilites, would likely need closer to 2 weeks. Advised will schedule surgery and call back once confirmed. Patient desires to complete surgery  Instructions at call next week.

## 2017-01-07 NOTE — Telephone Encounter (Signed)
Call to patient. Voice mail has number confirmation. Per ROI can leave message. Left message calling to check date preferences for surgery. Left message first available date is 01-24-17 and to call back to discuss.

## 2017-01-10 NOTE — Telephone Encounter (Signed)
Call to patient. Confirmed surgery date of 01-24-17 at [facility]' hospital at 0730. Surgery instruction sheet reviewed and printed copy to be provided at consult appointment on Thursday 01-13-17 in surgery packet.  Routing to provider for final review. Patient agreeable to disposition. Will close encounter.

## 2017-01-12 NOTE — Patient Instructions (Addendum)
Your procedure is scheduled on:  Monday, Feb. 26, 2018  Enter through the [employer] [facility] at:  6:00 AM  Pick up the phone at the desk and dial [phone].  Call this number if you have problems the morning of surgery: [phone].  Remember: Do NOT eat food or drink after:  Midnight Sunday, Feb. 25, 2018  Take these medicines the morning of surgery with a SIP OF WATER:  Wellbutrin  Stop ALL herbal medications and fish oil at this time  Do NOT smoke the day of surgery.  Do NOT wear jewelry (body piercing), metal hair clips/bobby pins, make-up, or nail polish. Do NOT wear lotions, powders, or perfumes.  You may wear deodorant. Do NOT shave for 48 hours prior to surgery. Do NOT bring valuables to the hospital. Contacts, dentures, or bridgework may not be worn into surgery.  Leave suitcase in car.  After surgery it may be brought to your room.  For patients admitted to the hospital, checkout time is 11:00 AM the day of discharge.  Have a responsible adult drive you home and stay with you for 24 hours after your procedure  Bring a copy of your healthcare power of attorney and living will documents.  **Effective Friday, Jan. 12, 2018, [facility] will implement no hospital visitations from children age 70 and younger due to a steady increase in flu activity in our community and hospitals. **

## 2017-01-13 ENCOUNTER — Encounter (HOSPITAL_COMMUNITY): Payer: Self-pay

## 2017-01-13 ENCOUNTER — Encounter: Payer: Self-pay | Admitting: Obstetrics and Gynecology

## 2017-01-13 ENCOUNTER — Ambulatory Visit (INDEPENDENT_AMBULATORY_CARE_PROVIDER_SITE_OTHER): Payer: BLUE CROSS/BLUE SHIELD | Admitting: Obstetrics and Gynecology

## 2017-01-13 ENCOUNTER — Encounter (HOSPITAL_COMMUNITY)
Admission: RE | Admit: 2017-01-13 | Discharge: 2017-01-13 | Disposition: A | Discharge status: Home / Self Care | Payer: BLUE CROSS/BLUE SHIELD | Source: Ambulatory Visit | Attending: Obstetrics and Gynecology | Admitting: Obstetrics and Gynecology

## 2017-01-13 VITALS — BP 110/60 | HR 64 | Resp 16 | Wt 150.0 lb

## 2017-01-13 DIAGNOSIS — N941 Unspecified dyspareunia: Secondary | ICD-10-CM

## 2017-01-13 DIAGNOSIS — Z833 Family history of diabetes mellitus: Secondary | ICD-10-CM | POA: Insufficient documentation

## 2017-01-13 DIAGNOSIS — Z8249 Family history of ischemic heart disease and other diseases of the circulatory system: Secondary | ICD-10-CM | POA: Diagnosis not present

## 2017-01-13 DIAGNOSIS — R102 Pelvic and perineal pain: Secondary | ICD-10-CM

## 2017-01-13 DIAGNOSIS — Z809 Family history of malignant neoplasm, unspecified: Secondary | ICD-10-CM | POA: Diagnosis not present

## 2017-01-13 DIAGNOSIS — D259 Leiomyoma of uterus, unspecified: Secondary | ICD-10-CM | POA: Diagnosis not present

## 2017-01-13 DIAGNOSIS — Z01812 Encounter for preprocedural laboratory examination: Secondary | ICD-10-CM | POA: Diagnosis present

## 2017-01-13 DIAGNOSIS — F329 Major depressive disorder, single episode, unspecified: Secondary | ICD-10-CM | POA: Diagnosis not present

## 2017-01-13 DIAGNOSIS — F32A Depression, unspecified: Secondary | ICD-10-CM | POA: Insufficient documentation

## 2017-01-13 DIAGNOSIS — N949 Unspecified condition associated with female genital organs and menstrual cycle: Secondary | ICD-10-CM

## 2017-01-13 DIAGNOSIS — N9489 Other specified conditions associated with female genital organs and menstrual cycle: Secondary | ICD-10-CM

## 2017-01-13 DIAGNOSIS — F3289 Other specified depressive episodes: Secondary | ICD-10-CM

## 2017-01-13 HISTORY — DX: Major depressive disorder, single episode, unspecified: F32.9

## 2017-01-13 HISTORY — DX: Depression, unspecified: F32.A

## 2017-01-13 LAB — CBC
HCT: 38.9 % (ref 36.0–46.0)
HEMOGLOBIN: 13.2 g/dL (ref 12.0–15.0)
MCH: 25.1 pg — ABNORMAL LOW (ref 26.0–34.0)
MCHC: 33.9 g/dL (ref 30.0–36.0)
MCV: 74 fL — ABNORMAL LOW (ref 78.0–100.0)
Platelets: 295 10*3/uL (ref 150–400)
RBC: 5.26 MIL/uL — ABNORMAL HIGH (ref 3.87–5.11)
RDW: 15.4 % (ref 11.5–15.5)
WBC: 5.2 10*3/uL (ref 4.0–10.5)

## 2017-01-13 NOTE — Progress Notes (Addendum)
GYNECOLOGY  VISIT   HPI: 46 y.o.   Single  African American  female   G1P0010 with Patient's last menstrual period was 12/30/2010 (approximate).   here for pre surgical visit. Patient is scheduled for laparoscopic ovarian cystectomy 01/24/17.     The patient has a persistent complex right adnexal mass, dyspareunia, mild pelvic discomfort. The mass feels adherent to her vaginal cuff on exam. Normal CA 125, premenopausal FSH  GYNECOLOGIC HISTORY: Patient's last menstrual period was 12/30/2010 (approximate). Contraception:hysterectomy  Menopausal hormone therapy: none         OB History    Gravida Para Term Preterm AB Living   1 0 0 0 1 0   SAB TAB Ectopic Multiple Live Births   1 0 0 0 0         Patient Active Problem List   Diagnosis Date Noted  . Depression     Past Medical History:  Diagnosis Date  . Depression   . Fibroid     Past Surgical History:  Procedure Laterality Date  . ACHILLES TENDON REPAIR Right 10/2006  . LAPAROSCOPIC ASSISTED VAGINAL HYSTERECTOMY  01/19/11  . UTERINE ARTERY EMBOLIZATION  09/2003    Current Outpatient Prescriptions  Medication Sig Dispense Refill  . b complex vitamins tablet Take 1 tablet by mouth daily.    Marland Kitchen buPROPion (WELLBUTRIN XL) 300 MG 24 hr tablet Take 300 mg by mouth daily.    . cholecalciferol (VITAMIN D) 1000 units tablet Take 1,000 Units by mouth daily.    . fish oil-omega-3 fatty acids 1000 MG capsule Take 1 g by mouth 3 (three) times daily.    Marland Kitchen VYVANSE 60 MG capsule Take 1 capsule by mouth daily.  0   No current facility-administered medications for this visit.      ALLERGIES: Patient has no known allergies.  Family History  Problem Relation Age of Onset  . Hypertension Mother   . Diabetes Father   . Pancreatic cancer Father 37    pancreatic  . Hypertension Sister   . Cancer Paternal Uncle     rare, unsure type  . Cancer Maternal Grandmother     ? spinal  . Pancreatic cancer Other     paternal great uncle     Social History   Social History  . Marital status: Single    Spouse name: N/A  . Number of children: 0  . Years of education: N/A   Occupational History  . Not on file.   Social History Main Topics  . Smoking status: Never Smoker  . Smokeless tobacco: Never Used  . Alcohol use 1.2 oz/week    2 Standard drinks or equivalent per week     Comment: occasionally  . Drug use: No  . Sexual activity: Yes    Partners: Male    Birth control/ protection: Surgical   Other Topics Concern  . Not on file   Social History Narrative  . No narrative on file    Review of Systems  Constitutional: Negative.   HENT: Negative.   Eyes: Negative.   Respiratory: Negative.   Cardiovascular: Negative.   Gastrointestinal: Negative.   Genitourinary: Negative.   Musculoskeletal: Negative.   Skin: Negative.   Neurological: Negative.   Endo/Heme/Allergies: Negative.   Psychiatric/Behavioral: Negative.     PHYSICAL EXAMINATION:    BP 110/60 (BP Location: Right Arm, Patient Position: Sitting, Cuff Size: Normal)   Pulse 64   Resp 16   Wt 150 lb (68 kg)  LMP 12/30/2010 (Approximate)   BMI 27.44 kg/m     General appearance: alert, cooperative and appears stated age Neck: no adenopathy, supple, symmetrical, trachea midline and thyroid normal to inspection and palpation Heart: regular rate and rhythm Lungs: CTAB Abdomen: soft, non-tender; bowel sounds normal; no masses,  no organomegaly Extremities: normal, atraumatic, no cyanosis Skin: normal color, texture and turgor, no rashes or lesions Lymph: normal cervical supraclavicular and inguinal nodes Neurologic: grossly normal  ASSESSMENT Complex right adnexal mass, normal CA 125, mild pain and dyspareunia    PLAN Discussed Laparoscopy with right ovarian cystectomy and right salpingectomy, possible RSO, left salpingectomy, possible cystoscopy. Discussed the risks of infection, bleeding, damage to bowel/baldder/ureters or vessels.  Discussed possible need for laparotomy Will do a bowel prep prior to surgery Will pre-treat with Lovenox and antibiotics She has a few scars from her LAVH, we discussed possibly using these same incisions if possible.   An After Visit Summary was printed and given to the patient.   01/23/17 Addendum: The patient's MRI of her spine from 07/06/16 was reviewed. No mention of her pelvic structures.

## 2017-01-23 ENCOUNTER — Encounter (HOSPITAL_COMMUNITY): Payer: Self-pay | Admitting: Anesthesiology

## 2017-01-23 MED ORDER — ENOXAPARIN SODIUM 40 MG/0.4ML ~~LOC~~ SOLN
40.0000 mg | SUBCUTANEOUS | Status: AC
  Administered 2017-01-24: 40 mg via SUBCUTANEOUS
  Filled 2017-01-23: qty 0.4

## 2017-01-23 MED ORDER — DEXTROSE 5 % IV SOLN
2.0000 g | INTRAVENOUS | Status: AC
  Administered 2017-01-24: 2 g via INTRAVENOUS
  Filled 2017-01-23: qty 2

## 2017-01-23 NOTE — Anesthesia Preprocedure Evaluation (Addendum)
Anesthesia Evaluation  Patient identified by MRN, date of birth, ID band Patient awake    Reviewed: Allergy & Precautions, NPO status , Patient's Chart, lab work & pertinent test results  Airway Mallampati: I  TM Distance: >3 FB Neck ROM: Full   Comment: Overbite Dental  (+) Teeth Intact, Dental Advisory Given   Pulmonary neg pulmonary ROS,    breath sounds clear to auscultation       Cardiovascular negative cardio ROS   Rhythm:Regular Rate:Normal     Neuro/Psych PSYCHIATRIC DISORDERS Depression negative neurological ROS     GI/Hepatic negative GI ROS, Neg liver ROS,   Endo/Other  negative endocrine ROS  Renal/GU negative Renal ROS  negative genitourinary   Musculoskeletal negative musculoskeletal ROS (+)   Abdominal   Peds negative pediatric ROS (+)  Hematology negative hematology ROS (+)   Anesthesia Other Findings   Reproductive/Obstetrics negative OB ROS                            Anesthesia Physical Anesthesia Plan  ASA: II  Anesthesia Plan: General   Post-op Pain Management:    Induction: Intravenous  Airway Management Planned: Oral ETT  Additional Equipment:   Intra-op Plan:   Post-operative Plan: Extubation in OR  Informed Consent: I have reviewed the patients History and Physical, chart, labs and discussed the procedure including the risks, benefits and alternatives for the proposed anesthesia with the patient or authorized representative who has indicated his/her understanding and acceptance.   Dental advisory given  Plan Discussed with: CRNA  Anesthesia Plan Comments:         Anesthesia Quick Evaluation

## 2017-01-24 ENCOUNTER — Encounter (HOSPITAL_COMMUNITY): Payer: Self-pay

## 2017-01-24 ENCOUNTER — Encounter (HOSPITAL_COMMUNITY)
Admission: RE | Disposition: A | Discharge status: Home / Self Care | Payer: Self-pay | Source: Ambulatory Visit | Attending: Obstetrics and Gynecology

## 2017-01-24 ENCOUNTER — Ambulatory Visit (HOSPITAL_COMMUNITY): Payer: BLUE CROSS/BLUE SHIELD | Admitting: Anesthesiology

## 2017-01-24 ENCOUNTER — Ambulatory Visit (HOSPITAL_COMMUNITY)
Admission: RE | Admit: 2017-01-24 | Discharge: 2017-01-24 | Disposition: A | Discharge status: Home / Self Care | Payer: BLUE CROSS/BLUE SHIELD | Source: Ambulatory Visit | Attending: Obstetrics and Gynecology | Admitting: Obstetrics and Gynecology

## 2017-01-24 DIAGNOSIS — N941 Unspecified dyspareunia: Secondary | ICD-10-CM | POA: Diagnosis not present

## 2017-01-24 DIAGNOSIS — N8301 Follicular cyst of right ovary: Secondary | ICD-10-CM | POA: Insufficient documentation

## 2017-01-24 DIAGNOSIS — N949 Unspecified condition associated with female genital organs and menstrual cycle: Secondary | ICD-10-CM | POA: Diagnosis not present

## 2017-01-24 DIAGNOSIS — N838 Other noninflammatory disorders of ovary, fallopian tube and broad ligament: Secondary | ICD-10-CM | POA: Diagnosis not present

## 2017-01-24 DIAGNOSIS — R8279 Other abnormal findings on microbiological examination of urine: Secondary | ICD-10-CM | POA: Diagnosis not present

## 2017-01-24 DIAGNOSIS — N736 Female pelvic peritoneal adhesions (postinfective): Secondary | ICD-10-CM | POA: Diagnosis not present

## 2017-01-24 DIAGNOSIS — N9489 Other specified conditions associated with female genital organs and menstrual cycle: Secondary | ICD-10-CM | POA: Insufficient documentation

## 2017-01-24 DIAGNOSIS — N803 Endometriosis of pelvic peritoneum: Secondary | ICD-10-CM | POA: Insufficient documentation

## 2017-01-24 DIAGNOSIS — F329 Major depressive disorder, single episode, unspecified: Secondary | ICD-10-CM | POA: Diagnosis not present

## 2017-01-24 HISTORY — PX: CYSTOSCOPY: SHX5120

## 2017-01-24 HISTORY — PX: LAPAROSCOPIC OVARIAN CYSTECTOMY: SHX6248

## 2017-01-24 HISTORY — PX: LAPAROSCOPIC LYSIS OF ADHESIONS: SHX5905

## 2017-01-24 SURGERY — EXCISION, CYST, OVARY, LAPAROSCOPIC
Anesthesia: General | Site: Urethra | Laterality: Right

## 2017-01-24 MED ORDER — LIDOCAINE HCL (CARDIAC) 20 MG/ML IV SOLN
INTRAVENOUS | Status: AC
  Filled 2017-01-24: qty 5

## 2017-01-24 MED ORDER — ROCURONIUM BROMIDE 100 MG/10ML IV SOLN
INTRAVENOUS | Status: DC | PRN
Start: 2017-01-24 — End: 2017-01-24
  Administered 2017-01-24: 50 mg via INTRAVENOUS

## 2017-01-24 MED ORDER — BUPIVACAINE HCL (PF) 0.25 % IJ SOLN
INTRAMUSCULAR | Status: DC | PRN
  Administered 2017-01-24: 6 mL

## 2017-01-24 MED ORDER — OXYCODONE HCL 5 MG PO TABS
ORAL_TABLET | ORAL | Status: AC
  Filled 2017-01-24: qty 1

## 2017-01-24 MED ORDER — LACTATED RINGERS IR SOLN
Status: DC | PRN
  Administered 2017-01-24: 3000 mL

## 2017-01-24 MED ORDER — PHENYLEPHRINE HCL 10 MG/ML IJ SOLN
INTRAMUSCULAR | Status: DC | PRN
  Administered 2017-01-24 (×2): 80 ug via INTRAVENOUS
  Administered 2017-01-24: 40 ug via INTRAVENOUS

## 2017-01-24 MED ORDER — SUGAMMADEX SODIUM 200 MG/2ML IV SOLN
INTRAVENOUS | Status: AC
  Filled 2017-01-24: qty 2

## 2017-01-24 MED ORDER — IBUPROFEN 800 MG PO TABS: 800.0000 mg | ORAL_TABLET | Freq: Three times a day (TID) | ORAL | 0 refills | Status: DC | PRN

## 2017-01-24 MED ORDER — OXYCODONE-ACETAMINOPHEN 5-325 MG PO TABS: 1.0000 | ORAL_TABLET | ORAL | 0 refills | Status: DC | PRN

## 2017-01-24 MED ORDER — ONDANSETRON HCL 4 MG/2ML IJ SOLN
INTRAMUSCULAR | Status: DC | PRN
  Administered 2017-01-24: 4 mg via INTRAVENOUS

## 2017-01-24 MED ORDER — SUGAMMADEX SODIUM 200 MG/2ML IV SOLN
INTRAVENOUS | Status: DC | PRN
  Administered 2017-01-24: 140 mg via INTRAVENOUS

## 2017-01-24 MED ORDER — FENTANYL CITRATE (PF) 250 MCG/5ML IJ SOLN
INTRAMUSCULAR | Status: AC
  Filled 2017-01-24: qty 5

## 2017-01-24 MED ORDER — LACTATED RINGERS IV SOLN
INTRAVENOUS | Status: DC
  Administered 2017-01-24 (×2): via INTRAVENOUS

## 2017-01-24 MED ORDER — LACTATED RINGERS IV SOLN: INTRAVENOUS | Status: DC

## 2017-01-24 MED ORDER — MIDAZOLAM HCL 2 MG/2ML IJ SOLN
INTRAMUSCULAR | Status: AC
  Filled 2017-01-24: qty 2

## 2017-01-24 MED ORDER — HYDROMORPHONE HCL 1 MG/ML IJ SOLN: 0.2500 mg | INTRAMUSCULAR | Status: DC | PRN

## 2017-01-24 MED ORDER — ROCURONIUM BROMIDE 100 MG/10ML IV SOLN
INTRAVENOUS | Status: AC
  Filled 2017-01-24: qty 1

## 2017-01-24 MED ORDER — SCOPOLAMINE 1 MG/3DAYS TD PT72
1.0000 | MEDICATED_PATCH | Freq: Once | TRANSDERMAL | Status: DC
  Administered 2017-01-24: 1.5 mg via TRANSDERMAL

## 2017-01-24 MED ORDER — LIDOCAINE HCL (CARDIAC) 20 MG/ML IV SOLN
INTRAVENOUS | Status: DC | PRN
  Administered 2017-01-24: 80 mg via INTRAVENOUS

## 2017-01-24 MED ORDER — PROMETHAZINE HCL 25 MG/ML IJ SOLN: 6.2500 mg | INTRAMUSCULAR | Status: DC | PRN

## 2017-01-24 MED ORDER — FENTANYL CITRATE (PF) 100 MCG/2ML IJ SOLN
INTRAMUSCULAR | Status: DC | PRN
  Administered 2017-01-24 (×2): 50 ug via INTRAVENOUS
  Administered 2017-01-24: 100 ug via INTRAVENOUS
  Administered 2017-01-24 (×2): 50 ug via INTRAVENOUS

## 2017-01-24 MED ORDER — PROPOFOL 10 MG/ML IV BOLUS
INTRAVENOUS | Status: DC | PRN
  Administered 2017-01-24: 160 mg via INTRAVENOUS

## 2017-01-24 MED ORDER — DEXAMETHASONE SODIUM PHOSPHATE 4 MG/ML IJ SOLN
INTRAMUSCULAR | Status: AC
  Filled 2017-01-24: qty 1

## 2017-01-24 MED ORDER — PHENYLEPHRINE 40 MCG/ML (10ML) SYRINGE FOR IV PUSH (FOR BLOOD PRESSURE SUPPORT)
PREFILLED_SYRINGE | INTRAVENOUS | Status: AC
  Filled 2017-01-24: qty 10

## 2017-01-24 MED ORDER — HYDROMORPHONE HCL 1 MG/ML IJ SOLN
INTRAMUSCULAR | Status: AC
  Administered 2017-01-24: 0.5 mg
  Filled 2017-01-24: qty 1

## 2017-01-24 MED ORDER — KETOROLAC TROMETHAMINE 30 MG/ML IJ SOLN
INTRAMUSCULAR | Status: DC | PRN
  Administered 2017-01-24: 30 mg via INTRAVENOUS

## 2017-01-24 MED ORDER — OXYCODONE HCL 5 MG PO TABS
5.0000 mg | ORAL_TABLET | Freq: Once | ORAL | Status: AC | PRN
  Administered 2017-01-24: 5 mg via ORAL

## 2017-01-24 MED ORDER — PROPOFOL 10 MG/ML IV BOLUS
INTRAVENOUS | Status: AC
Start: 2017-01-24 — End: ?
  Filled 2017-01-24: qty 20

## 2017-01-24 MED ORDER — MEPERIDINE HCL 25 MG/ML IJ SOLN: 6.2500 mg | INTRAMUSCULAR | Status: DC | PRN

## 2017-01-24 MED ORDER — KETOROLAC TROMETHAMINE 30 MG/ML IJ SOLN
INTRAMUSCULAR | Status: AC
  Filled 2017-01-24: qty 1

## 2017-01-24 MED ORDER — MIDAZOLAM HCL 2 MG/2ML IJ SOLN
INTRAMUSCULAR | Status: DC | PRN
  Administered 2017-01-24: 2 mg via INTRAVENOUS

## 2017-01-24 MED ORDER — OXYCODONE HCL 5 MG/5ML PO SOLN: 5.0000 mg | Freq: Once | ORAL | Status: AC | PRN

## 2017-01-24 MED ORDER — BUPIVACAINE HCL (PF) 0.25 % IJ SOLN
INTRAMUSCULAR | Status: AC
  Filled 2017-01-24: qty 30

## 2017-01-24 MED ORDER — SODIUM CHLORIDE 0.9 % IJ SOLN
INTRAMUSCULAR | Status: AC
  Filled 2017-01-24: qty 10

## 2017-01-24 MED ORDER — DEXAMETHASONE SODIUM PHOSPHATE 4 MG/ML IJ SOLN
INTRAMUSCULAR | Status: DC | PRN
  Administered 2017-01-24: 4 mg via INTRAVENOUS

## 2017-01-24 MED ORDER — ONDANSETRON HCL 4 MG/2ML IJ SOLN
INTRAMUSCULAR | Status: AC
  Filled 2017-01-24: qty 2

## 2017-01-24 SURGICAL SUPPLY — 62 items
APPLICATOR ARISTA FLEXITIP XL (MISCELLANEOUS) ×7 IMPLANT
BAG URINE DRAINAGE (UROLOGICAL SUPPLIES) ×7 IMPLANT
BLADE SURG 10 STRL SS (BLADE) IMPLANT
CABLE HIGH FREQUENCY MONO STRZ (ELECTRODE) ×7 IMPLANT
CANISTER SUCT 3000ML (MISCELLANEOUS) ×7 IMPLANT
CATH FOLEY 3WAY  5CC 16FR (CATHETERS) ×2
CATH FOLEY 3WAY 5CC 16FR (CATHETERS) ×5 IMPLANT
CLOTH BEACON ORANGE TIMEOUT ST (SAFETY) ×7 IMPLANT
CONTAINER PREFILL 10% NBF 15ML (MISCELLANEOUS) ×7 IMPLANT
DECANTER SPIKE VIAL GLASS SM (MISCELLANEOUS) ×7 IMPLANT
DERMABOND ADVANCED (GAUZE/BANDAGES/DRESSINGS) ×2
DERMABOND ADVANCED .7 DNX12 (GAUZE/BANDAGES/DRESSINGS) ×5 IMPLANT
DISSECTOR BLUNT TIP ENDO 5MM (MISCELLANEOUS) ×7 IMPLANT
DRAPE WARM FLUID 44X44 (DRAPE) IMPLANT
DRSG OPSITE POSTOP 3X4 (GAUZE/BANDAGES/DRESSINGS) ×7 IMPLANT
DURAPREP 26ML APPLICATOR (WOUND CARE) ×7 IMPLANT
GAUZE SPONGE 4X4 16PLY XRAY LF (GAUZE/BANDAGES/DRESSINGS) IMPLANT
GLOVE BIO SURGEON STRL SZ 6.5 (GLOVE) ×12 IMPLANT
GLOVE BIO SURGEONS STRL SZ 6.5 (GLOVE) ×2
GLOVE BIOGEL PI IND STRL 7.0 (GLOVE) ×25 IMPLANT
GLOVE BIOGEL PI INDICATOR 7.0 (GLOVE) ×10
GOWN STRL REUS W/TWL LRG LVL3 (GOWN DISPOSABLE) ×21 IMPLANT
HEMOSTAT ARISTA ABSORB 3G PWDR (MISCELLANEOUS) ×7 IMPLANT
LIGASURE VESSEL 5MM BLUNT TIP (ELECTROSURGICAL) ×6 IMPLANT
NEEDLE HYPO 22GX1.5 SAFETY (NEEDLE) ×7 IMPLANT
NEEDLE INSUFFLATION 120MM (ENDOMECHANICALS) ×7 IMPLANT
NS IRRIG 1000ML POUR BTL (IV SOLUTION) IMPLANT
PACK ABDOMINAL GYN (CUSTOM PROCEDURE TRAY) IMPLANT
PACK LAPAROSCOPY BASIN (CUSTOM PROCEDURE TRAY) ×7 IMPLANT
PACK TRENDGUARD 450 HYBRID PRO (MISCELLANEOUS) ×5 IMPLANT
PACK TRENDGUARD 600 HYBRD PROC (MISCELLANEOUS) IMPLANT
PAD OB MATERNITY 4.3X12.25 (PERSONAL CARE ITEMS) ×7 IMPLANT
POUCH SPECIMEN RETRIEVAL 10MM (ENDOMECHANICALS) IMPLANT
PROTECTOR NERVE ULNAR (MISCELLANEOUS) ×14 IMPLANT
SCISSORS LAP 5X35 DISP (ENDOMECHANICALS) ×7 IMPLANT
SET CYSTO W/LG BORE CLAMP LF (SET/KITS/TRAYS/PACK) ×7 IMPLANT
SET IRRIG TUBING LAPAROSCOPIC (IRRIGATION / IRRIGATOR) ×7 IMPLANT
SHEARS HARMONIC ACE PLUS 36CM (ENDOMECHANICALS) ×7 IMPLANT
SLEEVE XCEL OPT CAN 5 100 (ENDOMECHANICALS) ×14 IMPLANT
SPONGE LAP 18X18 X RAY DECT (DISPOSABLE) IMPLANT
STAPLER VISISTAT 35W (STAPLE) IMPLANT
SUT PLAIN 2 0 XLH (SUTURE) IMPLANT
SUT VIC AB 0 CT1 18XCR BRD8 (SUTURE) IMPLANT
SUT VIC AB 0 CT1 27 (SUTURE)
SUT VIC AB 0 CT1 27XBRD ANBCTR (SUTURE) IMPLANT
SUT VIC AB 0 CT1 8-18 (SUTURE)
SUT VIC AB 3-0 PS1 18 (SUTURE)
SUT VIC AB 3-0 PS1 18X BRD (SUTURE) IMPLANT
SUT VICRYL 0 TIES 12 18 (SUTURE) IMPLANT
SUT VICRYL 0 UR6 27IN ABS (SUTURE) ×14 IMPLANT
SUT VICRYL 4-0 PS2 18IN ABS (SUTURE) ×7 IMPLANT
SYR CONTROL 10ML LL (SYRINGE) ×7 IMPLANT
SYSTEM CARTER THOMASON II (TROCAR) IMPLANT
TOWEL OR 17X24 6PK STRL BLUE (TOWEL DISPOSABLE) ×14 IMPLANT
TRAY FOLEY CATH SILVER 14FR (SET/KITS/TRAYS/PACK) IMPLANT
TRENDGUARD 450 HYBRID PRO PACK (MISCELLANEOUS) ×7
TRENDGUARD 600 HYBRID PROC PK (MISCELLANEOUS)
TROCAR ADV FIXATION 5X100MM (TROCAR) ×14 IMPLANT
TROCAR XCEL NON-BLD 11X100MML (ENDOMECHANICALS) ×7 IMPLANT
TROCAR XCEL NON-BLD 5MMX100MML (ENDOMECHANICALS) ×7 IMPLANT
WARMER LAPAROSCOPE (MISCELLANEOUS) ×7 IMPLANT
WATER STERILE IRR 1000ML POUR (IV SOLUTION) ×7 IMPLANT

## 2017-01-24 NOTE — OR Nursing (Signed)
58 Family updated on patient condition and surgical progress

## 2017-01-24 NOTE — Anesthesia Postprocedure Evaluation (Addendum)
Anesthesia Post Note  Patient: Virginia Hull  Procedure(s) Performed: Procedure(s) (LRB): LAPAROSCOPIC RIGHT SALPINGOOPHERECTOMY WITH BIOPSY RIGHT PELVIC WALL CYST (Right) CYSTOSCOPY (N/A) LAPAROSCOPIC LYSIS OF ADHESIONS (N/A)  Patient location during evaluation: PACU Anesthesia Type: General Level of consciousness: awake and alert Pain management: pain level controlled Vital Signs Assessment: post-procedure vital signs reviewed and stable Respiratory status: spontaneous breathing, nonlabored ventilation, respiratory function stable and patient connected to nasal cannula oxygen Cardiovascular status: blood pressure returned to baseline and stable Postop Assessment: no signs of nausea or vomiting Anesthetic complications: no        Last Vitals:  Vitals:   01/24/17 1130 01/24/17 1145  BP: 109/68   Pulse: 74 81  Resp: 18 17  Temp:      Last Pain:  Vitals:   01/24/17 1115  TempSrc:   PainSc: 4    Pain Goal: Patients Stated Pain Goal: 3 (01/24/17 0619)               [name]

## 2017-01-24 NOTE — Anesthesia Procedure Notes (Signed)
Procedure Name: Intubation Date/Time: 01/24/2017 7:20 AM Performed by: Raenette Rover Pre-anesthesia Checklist: Patient identified, Emergency Drugs available, Suction available and Patient being monitored Patient Re-evaluated:Patient Re-evaluated prior to inductionOxygen Delivery Method: Circle system utilized Preoxygenation: Pre-oxygenation with 100% oxygen Intubation Type: IV induction Ventilation: Mask ventilation without difficulty Laryngoscope Size: Mac and 3 Grade View: Grade III Tube type: Oral Tube size: 7.0 mm Number of attempts: 1 Airway Equipment and Method: Stylet Placement Confirmation: positive ETCO2,  CO2 detector and breath sounds checked- equal and bilateral Secured at: 22 cm Tube secured with: Tape Dental Injury: Teeth and Oropharynx as per pre-operative assessment

## 2017-01-24 NOTE — Discharge Instructions (Signed)
Laparoscopic SurgeryGeneral Anesthesia, Adult, Care After These instructions provide you with information about caring for yourself after your procedure. Your health care provider may also give you more specific instructions. Your treatment has been planned according to current medical practices, but problems sometimes occur. Call your health care provider if you have any problems or questions after your procedure. What can I expect after the procedure? After the procedure, it is common to have:  Vomiting.  A sore throat.  Mental slowness. It is common to feel:  Nauseous.  Cold or shivery.  Sleepy.  Tired.  Sore or achy, even in parts of your body where you did not have surgery. Follow these instructions at home: For at least 24 hours after the procedure:  Do not:  Participate in activities where you could fall or become injured.  Drive.  Use heavy machinery.  Drink alcohol.  Take sleeping pills or medicines that cause drowsiness.  Make important decisions or sign legal documents.  Take care of children on your own.  Rest. Eating and drinking  If you vomit, drink water, juice, or soup when you can drink without vomiting.  Drink enough fluid to keep your urine clear or pale yellow.  Make sure you have little or no nausea before eating solid foods.  Follow the diet recommended by your health care provider. General instructions  Have a responsible adult stay with you until you are awake and alert.  Return to your normal activities as told by your health care provider. Ask your health care provider what activities are safe for you.  Take over-the-counter and prescription medicines only as told by your health care provider.  If you smoke, do not smoke without supervision.  Keep all follow-up visits as told by your health care provider. This is important. Contact a health care provider if:  You continue to have nausea or vomiting at home, and medicines are not  helpful.  You cannot drink fluids or start eating again.  You cannot urinate after 8-12 hours.  You develop a skin rash.  You have fever.  You have increasing redness at the site of your procedure. Get help right away if:  You have difficulty breathing.  You have chest pain.  You have unexpected bleeding.  You feel that you are having a life-threatening or urgent problem. This information is not intended to replace advice given to you by your health care provider. Make sure you discuss any questions you have with your health care provider. Document Released: 02/21/2001 Document Revised: 04/19/2016 Document Reviewed: 10/30/2015 Elsevier Interactive Patient Education  2017 [employer].  Refer to this sheet in the next few weeks. These instructions provide you with information about caring for yourself after your procedure. Your health care provider may also give you more specific instructions. Your treatment has been planned according to current medical practices, but problems sometimes occur. Call your health care provider if you have any problems or questions after your procedure. What can I expect after the procedure? After the procedure, it is common to have:  A sore throat.  Discomfort in your shoulder.  Mild discomfort or cramping in your abdomen.  Gas pains.  Pain or soreness in the area where the surgical cut (incision) was made.  A bloated feeling.  Tiredness.  Nausea.  Vomiting. Follow these instructions at home: Medicines  Take over-the-counter and prescription medicines only as told by your health care provider.  Do not take aspirin because it can cause bleeding.  Do not  drive or operate heavy machinery while taking prescription pain medicine. Activity  Rest for the rest of the day.  Return to your normal activities as told by your health care provider. Ask your health care provider what activities are safe for you. Incision care  Follow  instructions from your health care provider about how to take care of your incision. Make sure you:  Wash your hands with soap and water before you change your bandage (dressing). If soap and water are not available, use hand sanitizer.  Change your dressing as told by your health care provider.  Leave stitches (sutures) in place. They may need to stay in place for 2 weeks or longer.  Check your incision area every day for signs of infection. Check for:  More redness, swelling, or pain.  More fluid or blood.  Warmth.  Pus or a bad smell. Other Instructions  Do not take baths, swim, or use a hot tub until your health care provider approves. You may take showers.  Keep all follow-up visits as told by your health care provider. This is important.  Have someone help you with your daily household tasks for the first few days. Contact a health care provider if:  You have more redness, swelling, or pain around your incision.  Your incision feels warm to the touch.  You have pus or a bad smell coming from your incision.  The edges of your incision break open after the sutures have been removed.  Your pain does not improve after 2-3 days.  You have a rash.  You repeatedly become dizzy or light-headed.  Your pain medicine is not helping.  You are constipated. Get help right away if:  You have a fever.  You faint.  You have increasing pain in your abdomen.  You have severe pain in one or both of your shoulders.  You have fluid or blood coming from your sutures or from your vagina.  You have shortness of breath or difficulty breathing.  You have chest pain or leg pain.  You have ongoing nausea, vomiting, or diarrhea. This information is not intended to replace advice given to you by your health care provider. Make sure you discuss any questions you have with your health care provider. Document Released: 06/04/2005 Document Revised: 04/19/2016 Document Reviewed:  10/26/2015 Elsevier Interactive Patient Education  2017 [employer].

## 2017-01-24 NOTE — Transfer of Care (Signed)
Immediate Anesthesia Transfer of Care Note  Patient: Virginia Hull  Procedure(s) Performed: Procedure(s): LAPAROSCOPIC RIGHT SALPINGOOPHERECTOMY WITH BIOPSY RIGHT PELVIC WALL CYST (Right) CYSTOSCOPY (N/A) LAPAROSCOPIC LYSIS OF ADHESIONS (N/A)  Patient Location: PACU  Anesthesia Type:General  Level of Consciousness: awake, alert , oriented and patient cooperative  Airway & Oxygen Therapy: Patient Spontanous Breathing and Patient connected to nasal cannula oxygen  Post-op Assessment: Report given to RN and Post -op Vital signs reviewed and stable  Post vital signs: Reviewed and stable  Last Vitals:  Vitals:   01/24/17 0619 01/24/17 1009  BP: 115/77   Pulse: 63 (P) 84  Resp: 16 (P) 14  Temp: 36.8 C (P) 36.4 C    Last Pain:  Vitals:   01/24/17 0619  TempSrc: Oral  PainSc: 0-No pain      Patients Stated Pain Goal: 3 ([id number])  Complications: No apparent anesthesia complications

## 2017-01-24 NOTE — Interval H&P Note (Signed)
History and Physical Interval Note:  01/24/2017 7:04 AM  Virginia Hull  has presented today for surgery, with the diagnosis of complex right adnexal mass, dyspareunia  The various methods of treatment have been discussed with the patient and family. After consideration of risks, benefits and other options for treatment, the patient has consented to  Procedure(s) with comments: LAPAROSCOPIC OVARIAN CYSTECTOMY/ possible oophorectomy (Right) BILATERAL SALPINGECTOMY (Bilateral) LAPAROTOMY (N/A) - possible  CYSTOSCOPY (N/A) as a surgical intervention .  The patient's history has been reviewed, patient examined, no change in status, stable for surgery.  I have reviewed the patient's chart and labs.  Questions were answered to the patient's satisfaction.     Salvadore Dom

## 2017-01-24 NOTE — H&P (View-Only) (Signed)
GYNECOLOGY  VISIT   HPI: 46 y.o.   Single  African American  female   [id number] with Patient's last menstrual period was 12/30/2010 (approximate).   here for pre surgical visit. Patient is scheduled for laparoscopic ovarian cystectomy 01/24/17.     The patient has a persistent complex right adnexal mass, dyspareunia, mild pelvic discomfort. The mass feels adherent to her vaginal cuff on exam. Normal CA 125, premenopausal FSH  GYNECOLOGIC HISTORY: Patient's last menstrual period was 12/30/2010 (approximate). Contraception:hysterectomy  Menopausal hormone therapy: none         OB History    Gravida Para Term Preterm AB Living   1 0 0 0 1 0   SAB TAB Ectopic Multiple Live Births   1 0 0 0 0         Patient Active Problem List   Diagnosis Date Noted  . Depression     Past Medical History:  Diagnosis Date  . Depression   . Fibroid     Past Surgical History:  Procedure Laterality Date  . ACHILLES TENDON REPAIR Right 10/2006  . LAPAROSCOPIC ASSISTED VAGINAL HYSTERECTOMY  01/19/11  . UTERINE ARTERY EMBOLIZATION  09/2003    Current Outpatient Prescriptions  Medication Sig Dispense Refill  . b complex vitamins tablet Take 1 tablet by mouth daily.    Marland Kitchen buPROPion (WELLBUTRIN XL) 300 MG 24 hr tablet Take 300 mg by mouth daily.    . cholecalciferol (VITAMIN D) 1000 units tablet Take 1,000 Units by mouth daily.    . fish oil-omega-3 fatty acids 1000 MG capsule Take 1 g by mouth 3 (three) times daily.    Marland Kitchen VYVANSE 60 MG capsule Take 1 capsule by mouth daily.  0   No current facility-administered medications for this visit.      ALLERGIES: Patient has no known allergies.  Family History  Problem Relation Age of Onset  . Hypertension Mother   . Diabetes Father   . Pancreatic cancer Father 5    pancreatic  . Hypertension Sister   . Cancer Paternal Uncle     rare, unsure type  . Cancer Maternal Grandmother     ? spinal  . Pancreatic cancer Other     paternal great uncle     Social History   Social History  . Marital status: Single    Spouse name: N/A  . Number of children: 0  . Years of education: N/A   Occupational History  . Not on file.   Social History Main Topics  . Smoking status: Never Smoker  . Smokeless tobacco: Never Used  . Alcohol use 1.2 oz/week    2 Standard drinks or equivalent per week     Comment: occasionally  . Drug use: No  . Sexual activity: Yes    Partners: Male    Birth control/ protection: Surgical   Other Topics Concern  . Not on file   Social History Narrative  . No narrative on file    Review of Systems  Constitutional: Negative.   HENT: Negative.   Eyes: Negative.   Respiratory: Negative.   Cardiovascular: Negative.   Gastrointestinal: Negative.   Genitourinary: Negative.   Musculoskeletal: Negative.   Skin: Negative.   Neurological: Negative.   Endo/Heme/Allergies: Negative.   Psychiatric/Behavioral: Negative.     PHYSICAL EXAMINATION:    BP 110/60 (BP Location: Right Arm, Patient Position: Sitting, Cuff Size: Normal)   Pulse 64   Resp 16   Wt 150 lb (68 kg)  LMP 12/30/2010 (Approximate)   BMI 27.44 kg/m     General appearance: alert, cooperative and appears stated age Neck: no adenopathy, supple, symmetrical, trachea midline and thyroid normal to inspection and palpation Heart: regular rate and rhythm Lungs: CTAB Abdomen: soft, non-tender; bowel sounds normal; no masses,  no organomegaly Extremities: normal, atraumatic, no cyanosis Skin: normal color, texture and turgor, no rashes or lesions Lymph: normal cervical supraclavicular and inguinal nodes Neurologic: grossly normal  ASSESSMENT Complex right adnexal mass, normal CA 125, mild pain and dyspareunia    PLAN Discussed Laparoscopy with right ovarian cystectomy and right salpingectomy, possible RSO, left salpingectomy, possible cystoscopy. Discussed the risks of infection, bleeding, damage to bowel/baldder/ureters or vessels.  Discussed possible need for laparotomy Will do a bowel prep prior to surgery Will pre-treat with Lovenox and antibiotics She has a few scars from her LAVH, we discussed possibly using these same incisions if possible.   An After Visit Summary was printed and given to the patient.   01/23/17 Addendum: The patient's MRI of her spine from 07/06/16 was reviewed. No mention of her pelvic structures.

## 2017-01-24 NOTE — Op Note (Signed)
Preoperative Diagnosis: right adnexal mass, dyspareunia  Postoperative Diagnosis: Right adnexal mass, significant adhesions between right adnexa and right pelvic side wall. Pelvic wall mass suspicious for fibroid   Procedure:  Diagnostic laparoscopy, extensive lysis of adhesions, right salpingo-oophorectomy, partial resection/biopsy of right pelvic side wall mass, cystoscopy  Surgeon: Dr Sumner Boast  Assistant: Dr Josefa Half  Anesthesia: General  EBL: 25  Fluids: 1,800 cc LR  Urine output: 123XX123 cc  Complications: none  Specimens: pelvic washings, right tube and ovary, biopsy of right pelvic side was mass.  Indications for surgery: The patient is a 46 year old female, who presented with RLQ abdominal/pelvic pain. Exam revealed a tender adnexal mass at the vaginal cuff. Ultrasound showed a complex cystic mass on the right. F/U imaging months later revealed an even more complex mass, possilbe myoma, possible hydrosalpinx. She had a premenopausal FSH and a normal CA 125 The patient is aware of the risks and complications involved with the surgery and consent was obtained prior to the procedure.  Findings: EUA: irregularly shaped 5 cm mass at the vaginal cuff. Laparoscopy: adhesion of the omentum to the LUQ of the abdominal wall. Adhesion of the appendix to the right adnexa (otherwise normal appearing appendix). Right adnexa densely adherent to the right pelvic side wall and right side of the vaginal cuff. Under and lateral to the adnexa was a firm irregular mass in the pelvic side wall, suspicious for a fibroid.  Normal liver edge, normal left ovary, no left tube, normal bladder and normal ureteral jet on the right (left not accessed).   Procedure: The patient was taken to the operating room with an IV in placed. She was placed in the dorsal lithotomy position. General anesthesia was administered. She was prepped and draped in the usual sterile fashion for an abdominal, vaginal surgery. A  sponge stick was placed in the vagina and a foley catheter was placed in the bladder.   The umbilicus was everted, injected with 0.25% marcaine and incised with a # 11 blade. 2 towel clips were used to elevated the umbilicus and a veress needle was placed into the abdominal cavity. The abdominal cavity was insufflated with CO2, with normal intraabdominal pressures. After adequate pneumo-insufflation the veress needle was removed and the 5 mm laparoscope was placed into the abdominal cavity using the opti-view trocar. The patient was placed in trendelenburg and the abdominal pelvic cavity was inspected. 3 more trocars were placed. 1 in either lower quadrant approximately 3 cm medial to the anterior superior iliac spine, and one approximately 5 cm superior to the pubic symphysis in the midline. These areas were injected with 0.25% marcaine, incised with a #11 blades. A 5 mm trocar was inserted in the right and left lower quadrant and a 5 mm trocar in the midline (this trocar was later exchanged for a # 11 trocar. All trocars were inserted with direct visualization with the laparoscope. The abdominal pelvic cavity was again inspected. The ureter on the right was identified, it appeared to be going into the adnexal mass.   The appendix was separated from the right adnexa with the scissors. The peritoneum was bleeding distal to the appendix and was cauterized with excellent hemostasis.  The adhesion of the omentum to the left upper quadrant was taken down with the harmonic scalpel.    The right pelvic side wall was opened with the harmonic scalpel. The course of the ureter was further delineated, The infundibulopelvic ligament was isolated, then cauterized and cut with  the ligasure device. The right tube and ovary were dissected off of the pelvic side wall and vaginal cuff with a combination of sharp and blunt dissection and use of the harmonic scalpel. Care was taken to stay away from the ureter. This was a  difficult dissection and took approximately 1.5 hours (total OR time was approximately 2.5 hours). The adnexa was also adherent to the bladder, this was taken down with sharp dissection. The adnexa was separated from the pelvic side wall and placed in the cul de sac.   The decision was made to perform cystoscopy at this time to confirm that the remaining mass was not in the bladder. The catheter was removed and cystoscopy was performed. Normal bladder mucosa throughout. A normal ureteral jet was noted on the right. The bladder was partially drained at this time, some urine was left in the bladder to help with separating the mass from the bladder. The mass was multi lobulated and was firm like a myoma (patient with h/o fibroid uterus prior to hysterectomy). The serosa over the mass was opened with the harmonic scalpel. The mass did not shell out of the serosa as expected. The upper portion was approximately 1 cm, this was shelled out without clear plans and removed with the harmonic scalpel (the specimen was removed through the 11 mm port). The decision was made to not try and remove the rest of the mass. The ureter was going right under the mass and no clear plans were seen. The concern was for ureteral injury with further attempts to remove the mass.   The endobag was placed into the abdominal cavity and the right adnexa was placed in the bag. The trocar was removed and the bag was brought up to the abdomen. The right adnexa was removed in pieces.   The 11 mm trocar was replaced. The edge of the mass that had been dissected was oozing, it was cauterized with excellent hemostasis.  The abdominal pelvic cavity was irrigated and suctioned dry. Pressure was released and hemostasis remained excellent.   The 11 mm midline trocar was removed and the [employer] device was used to place a stich of 0-Vicryl through the fascia. The abdominal cavity was desufflated and the trocars were removed. The skin was closed  with subcuticular stiches of 4-0 vicryl and dermabond was placed over the incisions.  The foley catheter which had been replaced prior to ending the case was removed.   The patient's abdomen and perineum were cleansed and she was taken out of the dorsal lithotomy position. Upon awakening she was extubated and taken to the recovery room in stable condition. The sponge and instrument counts were correct.

## 2017-01-26 ENCOUNTER — Telehealth: Payer: Self-pay | Admitting: Obstetrics and Gynecology

## 2017-01-26 ENCOUNTER — Encounter (HOSPITAL_COMMUNITY): Payer: Self-pay | Admitting: Obstetrics and Gynecology

## 2017-01-26 NOTE — Telephone Encounter (Signed)
I left a message for the patient to call. 

## 2017-01-28 ENCOUNTER — Telehealth: Payer: Self-pay | Admitting: Obstetrics and Gynecology

## 2017-01-28 NOTE — Telephone Encounter (Signed)
Called patient again to check on her, left a message to call with any problems otherwise will f/u at her post op appointment

## 2017-02-07 ENCOUNTER — Ambulatory Visit: Payer: BLUE CROSS/BLUE SHIELD | Admitting: Obstetrics and Gynecology

## 2017-02-09 ENCOUNTER — Ambulatory Visit: Payer: BLUE CROSS/BLUE SHIELD | Admitting: Obstetrics and Gynecology

## 2017-02-09 ENCOUNTER — Ambulatory Visit (INDEPENDENT_AMBULATORY_CARE_PROVIDER_SITE_OTHER): Payer: BLUE CROSS/BLUE SHIELD | Admitting: Obstetrics and Gynecology

## 2017-02-09 ENCOUNTER — Encounter: Payer: Self-pay | Admitting: Obstetrics and Gynecology

## 2017-02-09 VITALS — BP 100/60 | HR 72 | Resp 14 | Wt 152.0 lb

## 2017-02-09 DIAGNOSIS — N809 Endometriosis, unspecified: Secondary | ICD-10-CM

## 2017-02-09 DIAGNOSIS — Z90721 Acquired absence of ovaries, unilateral: Secondary | ICD-10-CM

## 2017-02-09 DIAGNOSIS — Z9889 Other specified postprocedural states: Secondary | ICD-10-CM

## 2017-02-09 NOTE — Progress Notes (Signed)
GYNECOLOGY  VISIT   HPI: 46 y.o.   Single  African American  female   G1P0010 with Patient's last menstrual period was 12/30/2010 (approximate).   here for post opt. Patient is 16 days s/p laparoscopic RSO, partial resection of right pelvic side wall mass and extensive lysis of adhesions. Pathology was benign, the biopsy of the pelvic side wall mass with endometriosis (fibrotic).  She is feeling better, the pain from prior to the surgery has resolved. She hasn't been sexually active yet. Normal bowel and bladder function.   GYNECOLOGIC HISTORY: Patient's last menstrual period was 12/30/2010 (approximate). Contraception:hysterectomy  Menopausal hormone therapy: none         OB History    Gravida Para Term Preterm AB Living   1 0 0 0 1 0   SAB TAB Ectopic Multiple Live Births   1 0 0 0 0         Patient Active Problem List   Diagnosis Date Noted  . Depression     Past Medical History:  Diagnosis Date  . Depression   . Fibroid     Past Surgical History:  Procedure Laterality Date  . ACHILLES TENDON REPAIR Right 10/2006  . CYSTOSCOPY N/A 01/24/2017   Procedure: CYSTOSCOPY;  Surgeon: Salvadore Dom, MD;  Location: [facility];  Service: Gynecology;  Laterality: N/A;  . LAPAROSCOPIC ASSISTED VAGINAL HYSTERECTOMY  01/19/11  . LAPAROSCOPIC LYSIS OF ADHESIONS N/A 01/24/2017   Procedure: LAPAROSCOPIC LYSIS OF ADHESIONS;  Surgeon: Salvadore Dom, MD;  Location: [facility];  Service: Gynecology;  Laterality: N/A;  . LAPAROSCOPIC OVARIAN CYSTECTOMY Right 01/24/2017   Procedure: LAPAROSCOPIC RIGHT SALPINGOOPHERECTOMY WITH BIOPSY RIGHT PELVIC WALL CYST;  Surgeon: Salvadore Dom, MD;  Location: [facility];  Service: Gynecology;  Laterality: Right;  . PELVIC LAPAROSCOPY    . UTERINE ARTERY EMBOLIZATION  09/2003    Current Outpatient Prescriptions  Medication Sig Dispense Refill  . b complex vitamins tablet Take 1 tablet by mouth daily.    Marland Kitchen buPROPion (WELLBUTRIN XL) 300 MG 24 hr tablet  Take 300 mg by mouth daily.    . cholecalciferol (VITAMIN D) 1000 units tablet Take 1,000 Units by mouth daily.    . fish oil-omega-3 fatty acids 1000 MG capsule Take 1 g by mouth 3 (three) times daily.    Marland Kitchen ibuprofen (ADVIL,MOTRIN) 800 MG tablet Take 1 tablet (800 mg total) by mouth every 8 (eight) hours as needed. 30 tablet 0  . oxyCODONE-acetaminophen (PERCOCET) 5-325 MG tablet Take 1-2 tablets by mouth every 4 (four) hours as needed. use only as much as needed to relieve pain 20 tablet 0  . VYVANSE 60 MG capsule Take 1 capsule by mouth daily.  0   No current facility-administered medications for this visit.      ALLERGIES: Patient has no known allergies.  Family History  Problem Relation Age of Onset  . Hypertension Mother   . Diabetes Father   . Pancreatic cancer Father 68    pancreatic  . Hypertension Sister   . Cancer Paternal Uncle     rare, unsure type  . Cancer Maternal Grandmother     ? spinal  . Pancreatic cancer Other     paternal great uncle    Social History   Social History  . Marital status: Single    Spouse name: N/A  . Number of children: 0  . Years of education: N/A   Occupational History  . Not on file.   Social History  Main Topics  . Smoking status: Never Smoker  . Smokeless tobacco: Never Used  . Alcohol use 1.2 oz/week    2 Standard drinks or equivalent per week     Comment: occasionally  . Drug use: No  . Sexual activity: Yes    Partners: Male    Birth control/ protection: Surgical   Other Topics Concern  . Not on file   Social History Narrative  . No narrative on file    Review of Systems  Constitutional: Negative.   HENT: Negative.   Eyes: Negative.   Respiratory: Negative.   Cardiovascular: Negative.   Gastrointestinal: Negative.   Genitourinary: Negative.   Musculoskeletal: Negative.   Skin: Negative.   Neurological: Negative.   Endo/Heme/Allergies: Negative.   Psychiatric/Behavioral: Negative.     PHYSICAL  EXAMINATION:    BP 100/60 (BP Location: Right Arm, Patient Position: Sitting, Cuff Size: Normal)   Pulse 72   Resp 14   Wt 152 lb (68.9 kg)   LMP 12/30/2010 (Approximate)   BMI 27.80 kg/m     General appearance: alert, cooperative and appears stated age Abdomen: soft, non-tender; non distended; no masses,  no organomegaly, incisions are healing well.   Pelvic: External genitalia:  no lesions              Urethra:  normal appearing urethra with no masses, tenderness or lesions              Bartholins and Skenes: normal                Bimanual Exam:  Uterus absent, no masses, not tender               Chaperone was present for exam.  Surgery and pictures from her surgery were reviewed.   ASSESSMENT 2 weeks s/p laparoscopic right oophorectomy, extensive lysis of adhesions, partial resection of pelvic side wall mass. The mass returned as fibrotic endometriosis, had the firm texture of a myoma, still 2-3 cm area remaining (not removed secondary to concern of ureteral injury) No mass felt on exam, not tender on exam    PLAN She will call with recurrent pain or dyspareunia Would treat medically prior to another surgery if needed (would refer to Onc if surgery was needed) She has an annual scheduled in 1/19 with Ms Raquel Sarna    An After Visit Summary was printed and given to the patient.  CC: Edman Circle, NP

## 2017-02-14 ENCOUNTER — Telehealth: Payer: Self-pay | Admitting: *Deleted

## 2017-02-14 NOTE — Telephone Encounter (Signed)
Short term disability forms received via fax. Call to patient. Left message to call back.

## 2017-02-15 NOTE — Telephone Encounter (Signed)
Patient returning your call.

## 2017-02-15 NOTE — Telephone Encounter (Signed)
Return call patient. Left message to call back.

## 2017-02-16 NOTE — Telephone Encounter (Signed)
Call from patient. Advised STD forms received from [place]. States she returned to work on 02-14-17. Original post op on 02-07-17 was rescheduled due to weather.Durward Fortes office notes from 02-09-17 visit will be sent as requested by [employer].   Routing to provider for final review. Patient agreeable to disposition. Will close encounter.

## 2017-04-29 NOTE — Addendum Note (Signed)
Addendum  created 04/29/17 1039 by [name], MD   Sign clinical note

## 2017-12-21 ENCOUNTER — Other Ambulatory Visit: Payer: Self-pay

## 2017-12-21 ENCOUNTER — Encounter: Payer: Self-pay | Admitting: Certified Nurse Midwife

## 2017-12-21 ENCOUNTER — Ambulatory Visit: Payer: BLUE CROSS/BLUE SHIELD | Admitting: Nurse Practitioner

## 2017-12-21 ENCOUNTER — Ambulatory Visit: Payer: BC Managed Care – PPO | Admitting: Certified Nurse Midwife

## 2017-12-21 VITALS — BP 118/66 | HR 80 | Resp 16 | Ht 61.25 in | Wt 164.0 lb

## 2017-12-21 DIAGNOSIS — N951 Menopausal and female climacteric states: Secondary | ICD-10-CM | POA: Diagnosis not present

## 2017-12-21 DIAGNOSIS — Z01419 Encounter for gynecological examination (general) (routine) without abnormal findings: Secondary | ICD-10-CM

## 2017-12-21 NOTE — Patient Instructions (Signed)

## 2017-12-21 NOTE — Progress Notes (Signed)
47 y.o. G1P0010 Single  African American Fe here for annual exam. Denies vaginal dryness or vaginal issues. Feels hormonal changes now after ovary removed last year. Weight gain with no changes, but not exercising, because it does not feel the same to run with the extra weight.  Sees Dr. Justin Mend for aex and labs and anxiety/depression management. Had evaluation with [facility] and all labs and hormones were normal. Patient felt better after screening. No other health issues today.  Patient's last menstrual period was 12/30/2010 (approximate).          Sexually active: Yes.    The current method of family planning is status post hysterectomy.    Exercising: Yes.    walking, running, yoga Smoker:  no  Health Maintenance: Pap:  10-30-10 neg History of Abnormal Pap: no MMG:  06-02-16 category d density birads 1:neg Self Breast exams: no Colonoscopy:  never BMD:   never TDaP:  unsure Hep C and HIV: HIV neg 2018 Labs: discuss today   reports that  has never smoked. she has never used smokeless tobacco. She reports that she drinks about 1.2 oz of alcohol per week. She reports that she does not use drugs.  Past Medical History:  Diagnosis Date  . Depression   . Fibroid     Past Surgical History:  Procedure Laterality Date  . ACHILLES TENDON REPAIR Right 10/2006  . CYSTOSCOPY N/A 01/24/2017   Procedure: CYSTOSCOPY;  Surgeon: Salvadore Dom, MD;  Location: [facility];  Service: Gynecology;  Laterality: N/A;  . LAPAROSCOPIC ASSISTED VAGINAL HYSTERECTOMY  01/19/11  . LAPAROSCOPIC LYSIS OF ADHESIONS N/A 01/24/2017   Procedure: LAPAROSCOPIC LYSIS OF ADHESIONS;  Surgeon: Salvadore Dom, MD;  Location: [facility];  Service: Gynecology;  Laterality: N/A;  . LAPAROSCOPIC OVARIAN CYSTECTOMY Right 01/24/2017   Procedure: LAPAROSCOPIC RIGHT SALPINGOOPHERECTOMY WITH BIOPSY RIGHT PELVIC WALL CYST;  Surgeon: Salvadore Dom, MD;  Location: [facility];  Service: Gynecology;  Laterality: Right;  . PELVIC  LAPAROSCOPY    . UTERINE ARTERY EMBOLIZATION  09/2003    Current Outpatient Medications  Medication Sig Dispense Refill  . b complex vitamins tablet Take 1 tablet by mouth daily.    Marland Kitchen buPROPion (WELLBUTRIN XL) 300 MG 24 hr tablet Take 300 mg by mouth daily.    . cholecalciferol (VITAMIN D) 1000 units tablet Take 1,000 Units by mouth daily.    . fish oil-omega-3 fatty acids 1000 MG capsule Take 1 g by mouth 3 (three) times daily.    Marland Kitchen VYVANSE 50 MG capsule Take 1 capsule by mouth daily.  0   No current facility-administered medications for this visit.     Family History  Problem Relation Age of Onset  . Hypertension Mother   . Diabetes Father   . Pancreatic cancer Father 10       pancreatic  . Hypertension Sister   . Cancer Paternal Uncle        rare, unsure type  . Cancer Maternal Grandmother        ? spinal  . Pancreatic cancer Other        paternal great uncle    ROS:  Pertinent items are noted in HPI.  Otherwise, a comprehensive ROS was negative.  Exam:   BP 118/66 (BP Location: Right Arm, Patient Position: Sitting, Cuff Size: Normal)   Pulse 80   Resp 16   Ht 5' 1.25" (1.556 m)   Wt 164 lb (74.4 kg)   LMP 12/30/2010 (Approximate)  BMI 30.74 kg/m  Height: 5' 1.25" (155.6 cm) Ht Readings from Last 3 Encounters:  12/21/17 5' 1.25" (1.556 m)  01/13/17 5\' 2"  (1.575 m)  12/13/16 5' 1.75" (1.568 m)    General appearance: alert, cooperative and appears stated age Head: Normocephalic, without obvious abnormality, atraumatic Neck: no adenopathy, supple, symmetrical, trachea midline and thyroid normal to inspection and palpation Lungs: clear to auscultation bilaterally Breasts: normal appearance, no masses or tenderness, No nipple retraction or dimpling, No nipple discharge or bleeding, No axillary or supraclavicular adenopathy Heart: regular rate and rhythm Abdomen: soft, non-tender; no masses,  no organomegaly Extremities: extremities normal, atraumatic, no cyanosis  or edema Skin: Skin color, texture, turgor normal. No rashes or lesions Lymph nodes: Cervical, supraclavicular, and axillary nodes normal. No abnormal inguinal nodes palpated Neurologic: Grossly normal   Pelvic: External genitalia:  no lesions              Urethra:  normal appearing urethra with no masses, tenderness or lesions              Bartholin's and Skene's: normal                 Vagina: normal appearing vagina with normal color and discharge, no lesions              Cervix: absent              Pap taken: No. Bimanual Exam:  Uterus:  uterus absent              Adnexa: normal adnexa and no mass, fullness, tenderness, right surgically absent               Rectovaginal: Confirms               Anus:  normal sphincter tone, no lesions  Chaperone present: yes  A:  Well Woman with normal exam  ?perimenopausal changes vs changes associated with ovary removal and adjusting now  Depression /anxiety with PCP management  Mammogram due  Declines labs  P:   Reviewed health and wellness pertinent to exam  Discussed findings and encouraged to resume exercising with another type that she will  Enjoy.  Continue follow up with PCP as indicated  Pap smear: no   counseled on breast self exam, mammography screening, feminine hygiene, menopause, adequate intake of calcium and vitamin D, diet and exercise  return annually or prn  An After Visit Summary was printed and given to the patient.

## 2018-12-28 ENCOUNTER — Ambulatory Visit: Payer: BC Managed Care – PPO | Admitting: Certified Nurse Midwife

## 2019-01-09 ENCOUNTER — Ambulatory Visit: Payer: BC Managed Care – PPO | Admitting: Certified Nurse Midwife

## 2019-01-09 ENCOUNTER — Encounter: Payer: Self-pay | Admitting: Certified Nurse Midwife

## 2019-01-09 ENCOUNTER — Other Ambulatory Visit: Payer: Self-pay

## 2019-01-09 VITALS — BP 112/68 | Ht 61.75 in | Wt 166.0 lb

## 2019-01-09 DIAGNOSIS — Z0000 Encounter for general adult medical examination without abnormal findings: Secondary | ICD-10-CM | POA: Diagnosis not present

## 2019-01-09 DIAGNOSIS — N6320 Unspecified lump in the left breast, unspecified quadrant: Secondary | ICD-10-CM

## 2019-01-09 DIAGNOSIS — E559 Vitamin D deficiency, unspecified: Secondary | ICD-10-CM | POA: Diagnosis not present

## 2019-01-09 DIAGNOSIS — N6310 Unspecified lump in the right breast, unspecified quadrant: Secondary | ICD-10-CM

## 2019-01-09 DIAGNOSIS — Z01411 Encounter for gynecological examination (general) (routine) with abnormal findings: Secondary | ICD-10-CM

## 2019-01-09 DIAGNOSIS — N951 Menopausal and female climacteric states: Secondary | ICD-10-CM | POA: Diagnosis not present

## 2019-01-09 DIAGNOSIS — Z1211 Encounter for screening for malignant neoplasm of colon: Secondary | ICD-10-CM

## 2019-01-09 NOTE — Progress Notes (Signed)
48 y.o. G1P0010 Single  African American Fe here for annual exam. Perimenopausal with occasional hot flashes and night sweats. Some vaginal dryness, no pain with sexual activity. No partner change or screening needed. Patient experiencing pain in right cervical area with numbness down into hand at times. Has not had evaluated and continues to bother her. No visual changes that she is aware of. Sees PCP sometimes yearly, not sure when she was seen last Maurice Small who has managed depression medication.  Desires today. Has noted no change in right breast area of known fibroglandular tissue feel per mammogram and [place] in 2017. No mammogram since that time. No other health issues today.  Patient's last menstrual period was 12/30/2010 (approximate).          Sexually active: Yes.    The current method of family planning is status post hysterectomy.    Exercising: Yes.    walking Smoker:  no  Review of Systems  Constitutional: Negative.   HENT: Negative.   Eyes: Negative.   Respiratory: Negative.   Cardiovascular: Negative.   Gastrointestinal: Negative.   Genitourinary: Negative.   Musculoskeletal: Negative.   Skin: Negative.   Neurological: Negative.   Endo/Heme/Allergies: Negative.   Psychiatric/Behavioral: Negative.     Health Maintenance: Pap:  10-30-10 neg History of Abnormal Pap: no MMG:  06-02-16 category d density birads 1:neg Self Breast exams: no Colonoscopy:  none BMD:   none TDaP:  unsure Shingles: no Pneumonia: no Hep C and HIV: HIV neg 2018 Labs: yes if needed   reports that she has never smoked. She has never used smokeless tobacco. She reports current alcohol use of about 1.0 standard drinks of alcohol per week. She reports that she does not use drugs.  Past Medical History:  Diagnosis Date  . Depression   . Fibroid     Past Surgical History:  Procedure Laterality Date  . ACHILLES TENDON REPAIR Right 10/2006  . CYSTOSCOPY N/A 01/24/2017   Procedure: CYSTOSCOPY;   Surgeon: Salvadore Dom, MD;  Location: [facility];  Service: Gynecology;  Laterality: N/A;  . LAPAROSCOPIC ASSISTED VAGINAL HYSTERECTOMY  01/19/11  . LAPAROSCOPIC LYSIS OF ADHESIONS N/A 01/24/2017   Procedure: LAPAROSCOPIC LYSIS OF ADHESIONS;  Surgeon: Salvadore Dom, MD;  Location: [facility];  Service: Gynecology;  Laterality: N/A;  . LAPAROSCOPIC OVARIAN CYSTECTOMY Right 01/24/2017   Procedure: LAPAROSCOPIC RIGHT SALPINGOOPHERECTOMY WITH BIOPSY RIGHT PELVIC WALL CYST;  Surgeon: Salvadore Dom, MD;  Location: [facility];  Service: Gynecology;  Laterality: Right;  . PELVIC LAPAROSCOPY    . UTERINE ARTERY EMBOLIZATION  09/2003    Current Outpatient Medications  Medication Sig Dispense Refill  . b complex vitamins tablet Take 1 tablet by mouth daily.    Marland Kitchen buPROPion (WELLBUTRIN XL) 300 MG 24 hr tablet Take 300 mg by mouth daily.    . cholecalciferol (VITAMIN D) 1000 units tablet Take 1,000 Units by mouth daily.    . fish oil-omega-3 fatty acids 1000 MG capsule Take 1 g by mouth 3 (three) times daily.    Marland Kitchen VYVANSE 50 MG capsule Take 1 capsule by mouth as needed.   0   No current facility-administered medications for this visit.     Family History  Problem Relation Age of Onset  . Hypertension Mother   . Diabetes Father   . Pancreatic cancer Father 92       pancreatic  . Hypertension Sister   . Cancer Paternal Uncle  rare, unsure type  . Cancer Maternal Grandmother        ? spinal  . Pancreatic cancer Other        paternal great uncle    ROS:  Pertinent items are noted in HPI.  Otherwise, a comprehensive ROS was negative.  Exam:   BP 112/68   Ht 5' 1.75" (1.568 m)   Wt 166 lb (75.3 kg)   LMP 12/30/2010 (Approximate)   BMI 30.61 kg/m  Height: 5' 1.75" (156.8 cm) Ht Readings from Last 3 Encounters:  01/09/19 5' 1.75" (1.568 m)  12/21/17 5' 1.25" (1.556 m)  01/13/17 5\' 2"  (1.575 m)    General appearance: alert, cooperative and appears stated age Head:  Normocephalic, without obvious abnormality, atraumatic Neck: no adenopathy, supple, symmetrical, trachea midline and thyroid normal to inspection and palpation Lungs: clear to auscultation bilaterally Breasts: normal appearance, no masses or tenderness, No nipple retraction or dimpling, No nipple discharge or bleeding, No axillary or supraclavicular adenopathy, known mass (fibroglandular tissue in right breast at 6 o'clock under areola) Left breast mass at 3 o'clock at 2-3 fb from aerola Heart: regular rate and rhythm Abdomen: soft, non-tender; no masses,  no organomegaly Extremities: extremities normal, atraumatic, no cyanosis or edema Skin: Skin color, texture, turgor normal. No rashes or lesions Lymph nodes: Cervical, supraclavicular, and axillary nodes normal. No abnormal inguinal nodes palpated Neurologic: Grossly normal   Pelvic: External genitalia:  no lesions              Urethra:  normal appearing urethra with no masses, tenderness or lesions              Bartholin's and Skene's: normal                 Vagina: normal appearing vagina with normal color and discharge, no lesions              Cervix: absent              Pap taken: No. Bimanual Exam:  Uterus:  uterus absent              Adnexa: normal adnexa and no mass, fullness, tenderness               Rectovaginal: Confirms               Anus:  normal sphincter tone, no lesions  Chaperone present: yes  A:  Well Woman with normal exam  Perimenopausal  Left breast mass, known right breast area with fibroglandular tissue noted per report 2017  Right neck pain with arm and hand involvement  Mammogram overdue  Colonoscopy due  Screening labs  P:   Reviewed health and wellness pertinent to exam  Discussed etiology and expectations  Discussed left breast finding and known right finding and should be followed up with density of D breast. Patient agreeable to diagnostic mammogram on left and [place]. Also agreeable if needed for right.  She will be scheduled prior to leaving today.  Discussed calling orthopedic for evaluation, patient plans to do this soon.  Discussed risks/benefits/warning signs of colonoscopy. Desires referral. She will be called with appointment information.   Continue follow up with MD as indicated for depression management  Labs: CMP, Lipid, Vitamin D, TSH  Pap smear: no   counseled on breast self exam, mammography screening, feminine hygiene, menopause, adequate intake of calcium and vitamin D, diet and exercise  return annually or prn  An After Visit Summary was  printed and given to the patient.

## 2019-01-09 NOTE — Progress Notes (Signed)
Patient scheduled while in office for bilateral Dx MMG and bilateral breast US, if needed, at [facility] on 01/17/19 at 8am. Patient is agreeable to date and time.

## 2019-01-10 LAB — COMPREHENSIVE METABOLIC PANEL
A/G RATIO: 1.7 (ref 1.2–2.2)
ALT: 14 IU/L (ref 0–32)
AST: 14 IU/L (ref 0–40)
Albumin: 4.3 g/dL (ref 3.8–4.8)
Alkaline Phosphatase: 52 IU/L (ref 39–117)
BILIRUBIN TOTAL: 0.4 mg/dL (ref 0.0–1.2)
BUN / CREAT RATIO: 12 (ref 9–23)
BUN: 13 mg/dL (ref 6–24)
CHLORIDE: 100 mmol/L (ref 96–106)
CO2: 24 mmol/L (ref 20–29)
Calcium: 9.2 mg/dL (ref 8.7–10.2)
Creatinine, Ser: 1.06 mg/dL — ABNORMAL HIGH (ref 0.57–1.00)
GFR calc non Af Amer: 62 mL/min/{1.73_m2} (ref >59)
GFR, EST AFRICAN AMERICAN: 72 mL/min/{1.73_m2} (ref >59)
Globulin, Total: 2.5 g/dL (ref 1.5–4.5)
Glucose: 73 mg/dL (ref 65–99)
POTASSIUM: 4.5 mmol/L (ref 3.5–5.2)
SODIUM: 137 mmol/L (ref 134–144)
TOTAL PROTEIN: 6.8 g/dL (ref 6.0–8.5)

## 2019-01-10 LAB — VITAMIN D 25 HYDROXY (VIT D DEFICIENCY, FRACTURES): VIT D 25 HYDROXY: 42 ng/mL (ref 30.0–100.0)

## 2019-01-10 LAB — TSH: TSH: 0.776 u[IU]/mL (ref 0.450–4.500)

## 2019-01-10 LAB — LIPID PANEL
CHOL/HDL RATIO: 2.6 ratio (ref 0.0–4.4)
Cholesterol, Total: 238 mg/dL — ABNORMAL HIGH (ref 100–199)
HDL: 90 mg/dL (ref >39)
LDL Calculated: 137 mg/dL — ABNORMAL HIGH (ref 0–99)
TRIGLYCERIDES: 55 mg/dL (ref 0–149)
VLDL Cholesterol Cal: 11 mg/dL (ref 5–40)

## 2019-01-11 ENCOUNTER — Other Ambulatory Visit: Payer: Self-pay | Admitting: Certified Nurse Midwife

## 2019-01-11 DIAGNOSIS — R899 Unspecified abnormal finding in specimens from other organs, systems and tissues: Secondary | ICD-10-CM

## 2019-01-17 ENCOUNTER — Ambulatory Visit
Admission: RE | Admit: 2019-01-17 | Discharge: 2019-01-17 | Disposition: A | Discharge status: Home / Self Care | Payer: BLUE CROSS/BLUE SHIELD | Source: Ambulatory Visit | Attending: Certified Nurse Midwife | Admitting: Certified Nurse Midwife

## 2019-01-17 ENCOUNTER — Other Ambulatory Visit: Payer: Self-pay | Admitting: Certified Nurse Midwife

## 2019-01-17 DIAGNOSIS — N6310 Unspecified lump in the right breast, unspecified quadrant: Secondary | ICD-10-CM

## 2019-01-17 DIAGNOSIS — N6320 Unspecified lump in the left breast, unspecified quadrant: Secondary | ICD-10-CM

## 2019-07-20 ENCOUNTER — Ambulatory Visit
Admission: RE | Admit: 2019-07-20 | Discharge: 2019-07-20 | Disposition: A | Discharge status: Home / Self Care | Payer: BC Managed Care – PPO | Source: Ambulatory Visit | Attending: Certified Nurse Midwife | Admitting: Certified Nurse Midwife

## 2019-07-20 ENCOUNTER — Other Ambulatory Visit: Payer: Self-pay

## 2019-07-20 ENCOUNTER — Other Ambulatory Visit: Payer: Self-pay | Admitting: Certified Nurse Midwife

## 2019-07-20 DIAGNOSIS — N6310 Unspecified lump in the right breast, unspecified quadrant: Secondary | ICD-10-CM

## 2019-07-20 DIAGNOSIS — N63 Unspecified lump in unspecified breast: Secondary | ICD-10-CM

## 2019-07-20 DIAGNOSIS — N6320 Unspecified lump in the left breast, unspecified quadrant: Secondary | ICD-10-CM

## 2019-11-20 ENCOUNTER — Ambulatory Visit: Payer: BC Managed Care – PPO | Attending: Internal Medicine

## 2019-11-20 DIAGNOSIS — Z20822 Contact with and (suspected) exposure to covid-19: Secondary | ICD-10-CM

## 2019-11-22 LAB — NOVEL CORONAVIRUS, NAA: SARS-CoV-2, NAA: NOT DETECTED

## 2020-01-14 ENCOUNTER — Other Ambulatory Visit: Payer: Self-pay

## 2020-01-15 ENCOUNTER — Ambulatory Visit (INDEPENDENT_AMBULATORY_CARE_PROVIDER_SITE_OTHER): Payer: BC Managed Care – PPO | Admitting: Certified Nurse Midwife

## 2020-01-15 ENCOUNTER — Other Ambulatory Visit: Payer: Self-pay

## 2020-01-15 ENCOUNTER — Encounter: Payer: Self-pay | Admitting: Certified Nurse Midwife

## 2020-01-15 VITALS — BP 104/70 | HR 70 | Temp 97.3°F | Resp 16 | Ht 61.75 in | Wt 171.0 lb

## 2020-01-15 DIAGNOSIS — Z01419 Encounter for gynecological examination (general) (routine) without abnormal findings: Secondary | ICD-10-CM

## 2020-01-15 DIAGNOSIS — Z0000 Encounter for general adult medical examination without abnormal findings: Secondary | ICD-10-CM | POA: Diagnosis not present

## 2020-01-15 DIAGNOSIS — E559 Vitamin D deficiency, unspecified: Secondary | ICD-10-CM | POA: Diagnosis not present

## 2020-01-15 NOTE — Patient Instructions (Signed)
EXERCISE AND DIET:  We recommended that you start or continue a regular exercise program for good health. Regular exercise means any activity that makes your heart beat faster and makes you sweat.  We recommend exercising at least 30 minutes per day at least 3 days a week, preferably 4 or 5.  We also recommend a diet low in fat and sugar.  Inactivity, poor dietary choices and obesity can cause diabetes, heart attack, stroke, and kidney damage, among others.    ALCOHOL AND SMOKING:  Women should limit their alcohol intake to no more than 7 drinks/beers/glasses of wine (combined, not each!) per week. Moderation of alcohol intake to this level decreases your risk of breast cancer and liver damage. And of course, no recreational drugs are part of a healthy lifestyle.  And absolutely no smoking or even second hand smoke. Most people know smoking can cause heart and lung diseases, but did you know it also contributes to weakening of your bones? Aging of your skin?  Yellowing of your teeth and nails?  CALCIUM AND VITAMIN D:  Adequate intake of calcium and Vitamin D are recommended.  The recommendations for exact amounts of these supplements seem to change often, but generally speaking 600 mg of calcium (either carbonate or citrate) and 800 units of Vitamin D per day seems prudent. Certain women may benefit from higher intake of Vitamin D.  If you are among these women, your doctor will have told you during your visit.    PAP SMEARS:  Pap smears, to check for cervical cancer or precancers,  have traditionally been done yearly, although recent scientific advances have shown that most women can have pap smears less often.  However, every woman still should have a physical exam from her gynecologist every year. It will include a breast check, inspection of the vulva and vagina to check for abnormal growths or skin changes, a visual exam of the cervix, and then an exam to evaluate the size and shape of the uterus and  ovaries.  And after 49 years of age, a rectal exam is indicated to check for rectal cancers. We will also provide age appropriate advice regarding health maintenance, like when you should have certain vaccines, screening for sexually transmitted diseases, bone density testing, colonoscopy, mammograms, etc.   MAMMOGRAMS:  All women over 40 years old should have a yearly mammogram. Many facilities now offer a "3D" mammogram, which may cost around $50 extra out of pocket. If possible,  we recommend you accept the option to have the 3D mammogram performed.  It both reduces the number of women who will be called back for extra views which then turn out to be normal, and it is better than the routine mammogram at detecting truly abnormal areas.    COLONOSCOPY:  Colonoscopy to screen for colon cancer is recommended for all women at age 50.  We know, you hate the idea of the prep.  We agree, BUT, having colon cancer and not knowing it is worse!!  Colon cancer so often starts as a polyp that can be seen and removed at colonscopy, which can quite literally save your life!  And if your first colonoscopy is normal and you have no family history of colon cancer, most women don't have to have it again for 10 years.  Once every ten years, you can do something that may end up saving your life, right?  We will be happy to help you get it scheduled when you are ready.    Be sure to check your insurance coverage so you understand how much it will cost.  It may be covered as a preventative service at no cost, but you should check your particular policy.      Perimenopause  Perimenopause is the normal time of life before and after menstrual periods stop completely (menopause). Perimenopause can begin 2-8 years before menopause, and it usually lasts for 1 year after menopause. During perimenopause, the ovaries may or may not produce an egg. What are the causes? This condition is caused by a natural change in hormone levels that  happens as you get older. What increases the risk? This condition is more likely to start at an earlier age if you have certain medical conditions or treatments, including:  A tumor of the pituitary gland in the brain.  A disease that affects the ovaries and hormone production.  Radiation treatment for cancer.  Certain cancer treatments, such as chemotherapy or hormone (anti-estrogen) therapy.  Heavy smoking and excessive alcohol use.  Family history of early menopause. What are the signs or symptoms? Perimenopausal changes affect each woman differently. Symptoms of this condition may include:  Hot flashes.  Night sweats.  Irregular menstrual periods.  Decreased sex drive.  Vaginal dryness.  Headaches.  Mood swings.  Depression.  Memory problems or trouble concentrating.  Irritability.  Tiredness.  Weight gain.  Anxiety.  Trouble getting pregnant. How is this diagnosed? This condition is diagnosed based on your medical history, a physical exam, your age, your menstrual history, and your symptoms. Hormone tests may also be done. How is this treated? In some cases, no treatment is needed. You and your health care provider should make a decision together about whether treatment is necessary. Treatment will be based on your individual condition and preferences. Various treatments are available, such as:  Menopausal hormone therapy (MHT).  Medicines to treat specific symptoms.  Acupuncture.  Vitamin or herbal supplements. Before starting treatment, make sure to let your health care provider know if you have a personal or family history of:  Heart disease.  Breast cancer.  Blood clots.  Diabetes.  Osteoporosis. Follow these instructions at home: Lifestyle  Do not use any products that contain nicotine or tobacco, such as cigarettes and e-cigarettes. If you need help quitting, ask your health care provider.  Eat a balanced diet that includes fresh  fruits and vegetables, whole grains, soybeans, eggs, lean meat, and low-fat dairy.  Get at least 30 minutes of physical activity on 5 or more days each week.  Avoid alcoholic and caffeinated beverages, as well as spicy foods. This may help prevent hot flashes.  Get 7-8 hours of sleep each night.  Dress in layers that can be removed to help you manage hot flashes.  Find ways to manage stress, such as deep breathing, meditation, or journaling. General instructions  Keep track of your menstrual periods, including: ? When they occur. ? How heavy they are and how long they last. ? How much time passes between periods.  Keep track of your symptoms, noting when they start, how often you have them, and how long they last.  Take over-the-counter and prescription medicines only as told by your health care provider.  Take vitamin supplements only as told by your health care provider. These may include calcium, vitamin E, and vitamin D.  Use vaginal lubricants or moisturizers to help with vaginal dryness and improve comfort during sex.  Talk with your health care provider before starting any herbal supplements.    Keep all follow-up visits as told by your health care provider. This is important. This includes any group therapy or counseling. Contact a health care provider if:  You have heavy vaginal bleeding or pass blood clots.  Your period lasts more than 2 days longer than normal.  Your periods are recurring sooner than 21 days.  You bleed after having sex. Get help right away if:  You have chest pain, trouble breathing, or trouble talking.  You have severe depression.  You have pain when you urinate.  You have severe headaches.  You have vision problems. Summary  Perimenopause is the time when a woman's body begins to move into menopause. This may happen naturally or as a result of other health problems or medical treatments.  Perimenopause can begin 2-8 years before  menopause, and it usually lasts for 1 year after menopause.  Perimenopausal symptoms can be managed through medicines, lifestyle changes, and complementary therapies such as acupuncture. This information is not intended to replace advice given to you by your health care provider. Make sure you discuss any questions you have with your health care provider. Document Revised: 10/28/2017 Document Reviewed: 12/21/2016 Elsevier Patient Education  2020 [employer]  

## 2020-01-15 NOTE — Progress Notes (Signed)
49 y.o. G1P0010 Single  African American Fe here for annual exam. Some hot flashes and occasional night sweats, no insomnia with. Vaginal dryness still a problem, using coconut oil, but not happy to have to use all the time. Some weight gain since surgery, 5 pounds since last year. Wellbutrin XL working OK with PCP management. Screening labs today. No other health issues today.  Patient's last menstrual period was 12/30/2010 (approximate).          Sexually active: No.  The current method of family planning is status post hysterectomy.    Exercising: No.  exercise Smoker:  no  Review of Systems  Constitutional: Negative.   HENT: Negative.   Eyes: Negative.   Respiratory: Negative.   Cardiovascular: Negative.   Gastrointestinal: Negative.   Genitourinary: Negative.   Musculoskeletal: Negative.   Skin: Negative.   Neurological: Negative.   Endo/Heme/Allergies: Negative.   Psychiatric/Behavioral: Negative.     Health Maintenance: Pap:  10-30-10 neg History of Abnormal Pap: no MMG:  Bilateral 01-17-2019, u/s 12/2018, u/s 07-20-2019 category 3: probably benign Self Breast exams: no Colonoscopy:  none BMD:   none TDaP:  Unsure will do with PCP Shingles: no Pneumonia: no Hep C and HIV: HIV neg 2018 Labs: yes   reports that she has never smoked. She has never used smokeless tobacco. She reports current alcohol use of about 1.0 standard drinks of alcohol per week. She reports that she does not use drugs.  Past Medical History:  Diagnosis Date  . Depression   . Fibroid     Past Surgical History:  Procedure Laterality Date  . ACHILLES TENDON REPAIR Right 10/2006  . CYSTOSCOPY N/A 01/24/2017   Procedure: CYSTOSCOPY;  Surgeon: Salvadore Dom, MD;  Location: [facility];  Service: Gynecology;  Laterality: N/A;  . LAPAROSCOPIC ASSISTED VAGINAL HYSTERECTOMY  01/19/11  . LAPAROSCOPIC LYSIS OF ADHESIONS N/A 01/24/2017   Procedure: LAPAROSCOPIC LYSIS OF ADHESIONS;  Surgeon: Salvadore Dom, MD;  Location: [facility];  Service: Gynecology;  Laterality: N/A;  . LAPAROSCOPIC OVARIAN CYSTECTOMY Right 01/24/2017   Procedure: LAPAROSCOPIC RIGHT SALPINGOOPHERECTOMY WITH BIOPSY RIGHT PELVIC WALL CYST;  Surgeon: Salvadore Dom, MD;  Location: [facility];  Service: Gynecology;  Laterality: Right;  . PELVIC LAPAROSCOPY    . UTERINE ARTERY EMBOLIZATION  09/2003    Current Outpatient Medications  Medication Sig Dispense Refill  . b complex vitamins tablet Take 1 tablet by mouth daily.    Marland Kitchen buPROPion (WELLBUTRIN XL) 300 MG 24 hr tablet Take 300 mg by mouth daily.    . cholecalciferol (VITAMIN D) 1000 units tablet Take 1,000 Units by mouth daily.    . fish oil-omega-3 fatty acids 1000 MG capsule Take 1 g by mouth 3 (three) times daily.     No current facility-administered medications for this visit.    Family History  Problem Relation Age of Onset  . Hypertension Mother   . Diabetes Father   . Pancreatic cancer Father 76       pancreatic  . Hypertension Sister   . Cancer Paternal Uncle        rare, unsure type  . Cancer Maternal Grandmother        ? spinal  . Pancreatic cancer Other        paternal great uncle    ROS:  Pertinent items are noted in HPI.  Otherwise, a comprehensive ROS was negative.  Exam:   BP 104/70   Pulse 70   Temp (!) 97.3  F (36.3 C) (Skin)   Resp 16   Ht 5' 1.75" (1.568 m)   Wt 171 lb (77.6 kg)   LMP 12/30/2010 (Approximate)   BMI 31.53 kg/m  Height: 5' 1.75" (156.8 cm) Ht Readings from Last 3 Encounters:  01/15/20 5' 1.75" (1.568 m)  01/09/19 5' 1.75" (1.568 m)  12/21/17 5' 1.25" (1.556 m)    General appearance: alert, cooperative and appears stated age Head: Normocephalic, without obvious abnormality, atraumatic Neck: no adenopathy, supple, symmetrical, trachea midline and thyroid normal to inspection and palpation Lungs: clear to auscultation bilaterally Breasts: normal appearance, no masses or tenderness, No nipple retraction or  dimpling, No nipple discharge or bleeding, No axillary or supraclavicular adenopathy Heart: regular rate and rhythm Abdomen: soft, non-tender; no masses,  no organomegaly Extremities: extremities normal, atraumatic, no cyanosis or edema Skin: Skin color, texture, turgor normal. No rashes or lesions Lymph nodes: Cervical, supraclavicular, and axillary nodes normal. No abnormal inguinal nodes palpated Neurologic: Grossly normal   Pelvic: External genitalia:  no lesions              Urethra:  normal appearing urethra with no masses, tenderness or lesions              Bartholin's and Skene's: normal                 Vagina: normal appearing vagina with normal color and discharge, no lesions, good normal appearing moisture in vagina              Cervix: absent              Pap taken: No. Bimanual Exam:  Uterus:  uterus absent              Adnexa: no mass, fullness, tenderness and adnexa surgically absent on left               Rectovaginal: Confirms               Anus:  normal sphincter tone, no lesions  Chaperone present: yes  A:  Well Woman with normal exam  Perimenopausal S/P TVH  Ovaries removed  Depression with PCP management  Vaginal dryness improved  Immunization update TDAP  Screening in labs  P:   Reviewed health and wellness pertinent to exam  Encouraged patient to discuss her depression medication when she has PCP visit.  Discussed continued use of coconut oil for dryness. Vaginal appearance is normal with good moisture present. Patient agreeable.  Patient declines will do with PCP  Labs: CMP,Lipid panel, CBC, TSH, Vitamin D  Pap smear: no   counseled on breast self exam, mammography screening, feminine hygiene, adequate intake of calcium and vitamin D, diet and exercise  return annually or prn  An After Visit Summary was printed and given to the patient.

## 2020-01-16 ENCOUNTER — Telehealth: Payer: Self-pay

## 2020-01-16 LAB — COMPREHENSIVE METABOLIC PANEL
ALT: 15 IU/L (ref 0–32)
AST: 18 IU/L (ref 0–40)
Albumin/Globulin Ratio: 1.6 (ref 1.2–2.2)
Albumin: 4.1 g/dL (ref 3.8–4.8)
Alkaline Phosphatase: 65 IU/L (ref 39–117)
BUN/Creatinine Ratio: 14 (ref 9–23)
BUN: 14 mg/dL (ref 6–24)
Bilirubin Total: <0.2 mg/dL (ref 0.0–1.2)
CO2: 23 mmol/L (ref 20–29)
Calcium: 9.5 mg/dL (ref 8.7–10.2)
Chloride: 102 mmol/L (ref 96–106)
Creatinine, Ser: 1.01 mg/dL — ABNORMAL HIGH (ref 0.57–1.00)
GFR calc Af Amer: 76 mL/min/{1.73_m2} (ref >59)
GFR calc non Af Amer: 66 mL/min/{1.73_m2} (ref >59)
Globulin, Total: 2.5 g/dL (ref 1.5–4.5)
Glucose: 79 mg/dL (ref 65–99)
Potassium: 4.8 mmol/L (ref 3.5–5.2)
Sodium: 140 mmol/L (ref 134–144)
Total Protein: 6.6 g/dL (ref 6.0–8.5)

## 2020-01-16 LAB — LIPID PANEL
Chol/HDL Ratio: 2.7 ratio (ref 0.0–4.4)
Cholesterol, Total: 229 mg/dL — ABNORMAL HIGH (ref 100–199)
HDL: 85 mg/dL (ref >39)
LDL Chol Calc (NIH): 129 mg/dL — ABNORMAL HIGH (ref 0–99)
Triglycerides: 87 mg/dL (ref 0–149)
VLDL Cholesterol Cal: 15 mg/dL (ref 5–40)

## 2020-01-16 LAB — CBC
Hematocrit: 41.8 % (ref 34.0–46.6)
Hemoglobin: 13.7 g/dL (ref 11.1–15.9)
MCH: 25.4 pg — ABNORMAL LOW (ref 26.6–33.0)
MCHC: 32.8 g/dL (ref 31.5–35.7)
MCV: 77 fL — ABNORMAL LOW (ref 79–97)
Platelets: 246 10*3/uL (ref 150–450)
RBC: 5.4 x10E6/uL — ABNORMAL HIGH (ref 3.77–5.28)
RDW: 15.4 % (ref 11.7–15.4)
WBC: 5.2 10*3/uL (ref 3.4–10.8)

## 2020-01-16 LAB — VITAMIN D 25 HYDROXY (VIT D DEFICIENCY, FRACTURES): Vit D, 25-Hydroxy: 28 ng/mL — ABNORMAL LOW (ref 30.0–100.0)

## 2020-01-16 LAB — TSH: TSH: 0.795 u[IU]/mL (ref 0.450–4.500)

## 2020-01-16 NOTE — Telephone Encounter (Signed)
Left message for call back.

## 2020-01-16 NOTE — Telephone Encounter (Signed)
-----   Message from Regina Eck, CNM sent at 01/16/2020 12:27 PM EST ----- Notify patient her TSH is normal Lipid panel has improved, cholesterol is 229 from 238, triglycerides are normal at 87, HDL is 85 normal good range LDL is slightly elevated at 129 normal <99 Work on good diet with fresh vegetables and fiber to help with profile Vitamin D is low at 28,previous was 42 She needs to start on Vitamin D 3 2000 IU and take daily, this should bring into normal range. Her CBC shows elevated RBC at 5.40 , WBC is normal and no anemia noted. She needs to follow up with her PCP Dr. Justin Mend regarding her RBC results Her Glucose, kidney and liver profile is essentially normal

## 2020-01-18 NOTE — Telephone Encounter (Signed)
Patient notified of results. See lab 

## 2020-01-21 ENCOUNTER — Inpatient Hospital Stay: Admission: RE | Admit: 2020-01-21 | Payer: BC Managed Care – PPO | Source: Ambulatory Visit

## 2020-02-18 ENCOUNTER — Encounter: Payer: Self-pay | Admitting: Certified Nurse Midwife

## 2020-11-23 IMAGING — MG DIGITAL DIAGNOSTIC BILATERAL MAMMOGRAM WITH TOMO AND CAD
6 of 12 series · 6 of 36 positions shown · non-contrast
Comparison: Previous exam(s).

CLINICAL DATA: 48-year-old patient with possible mass palpated on
clinical physical exam in the right breast, 6 o'clock region and in
the left breast 3 o'clock region.

EXAM:
DIGITAL DIAGNOSTIC BILATERAL MAMMOGRAM WITH CAD AND TOMO
ULTRASOUND BILATERAL BREAST

[L CC synth-2D (1 of 2)]
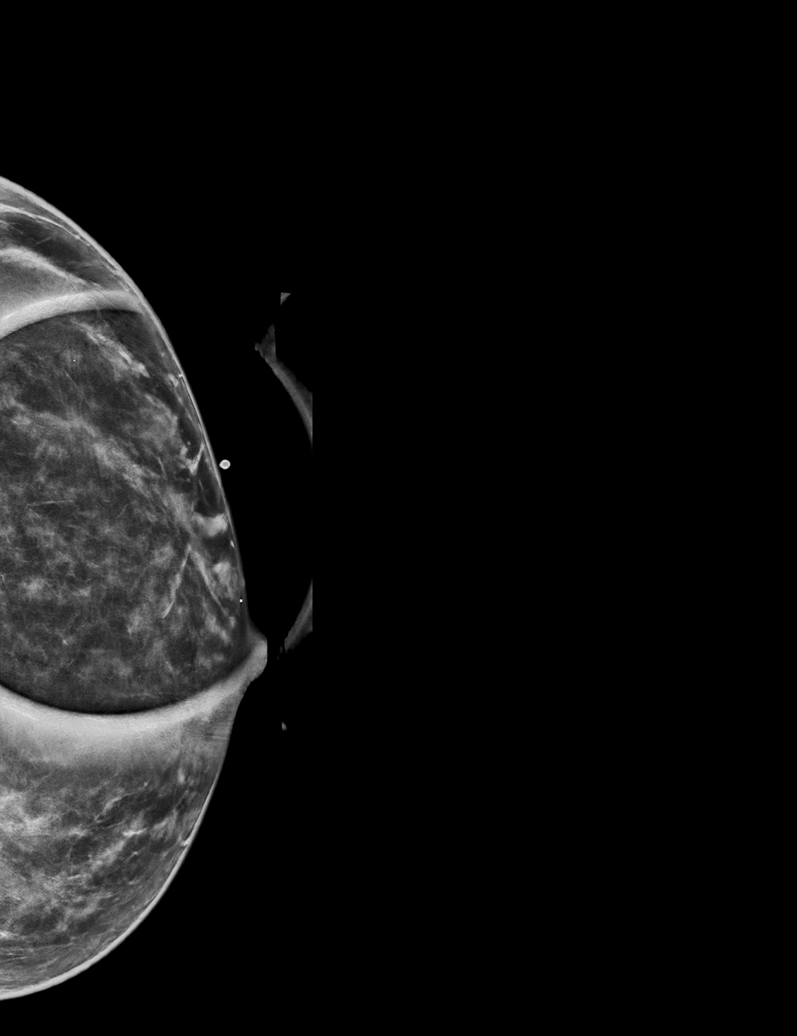

[R MLO synth-2D (1 of 2)]
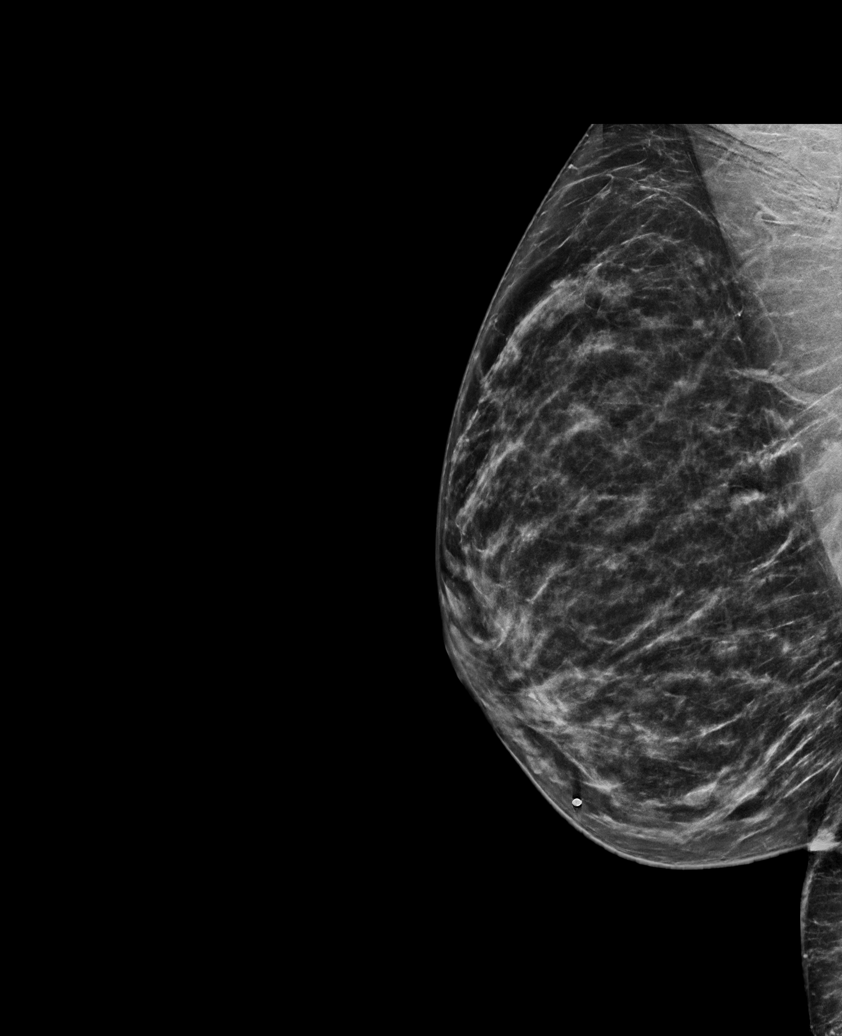

[L CC synth-2D (2 of 2)]
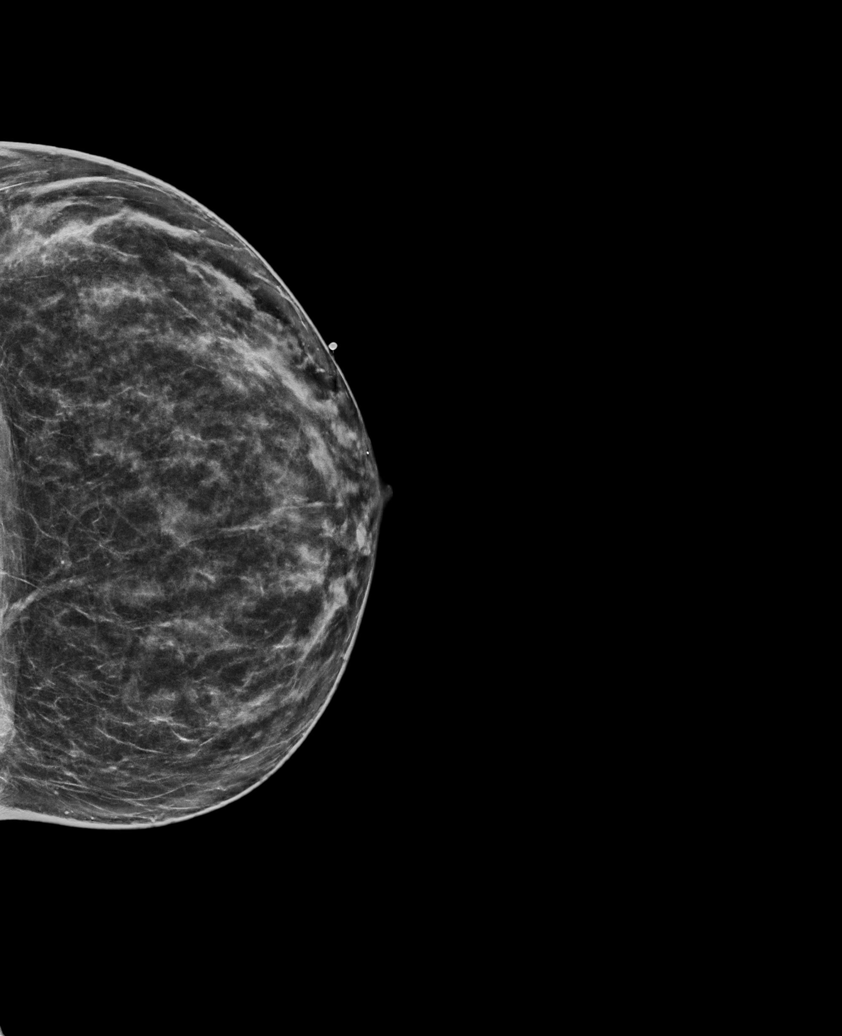

[L MLO synth-2D]
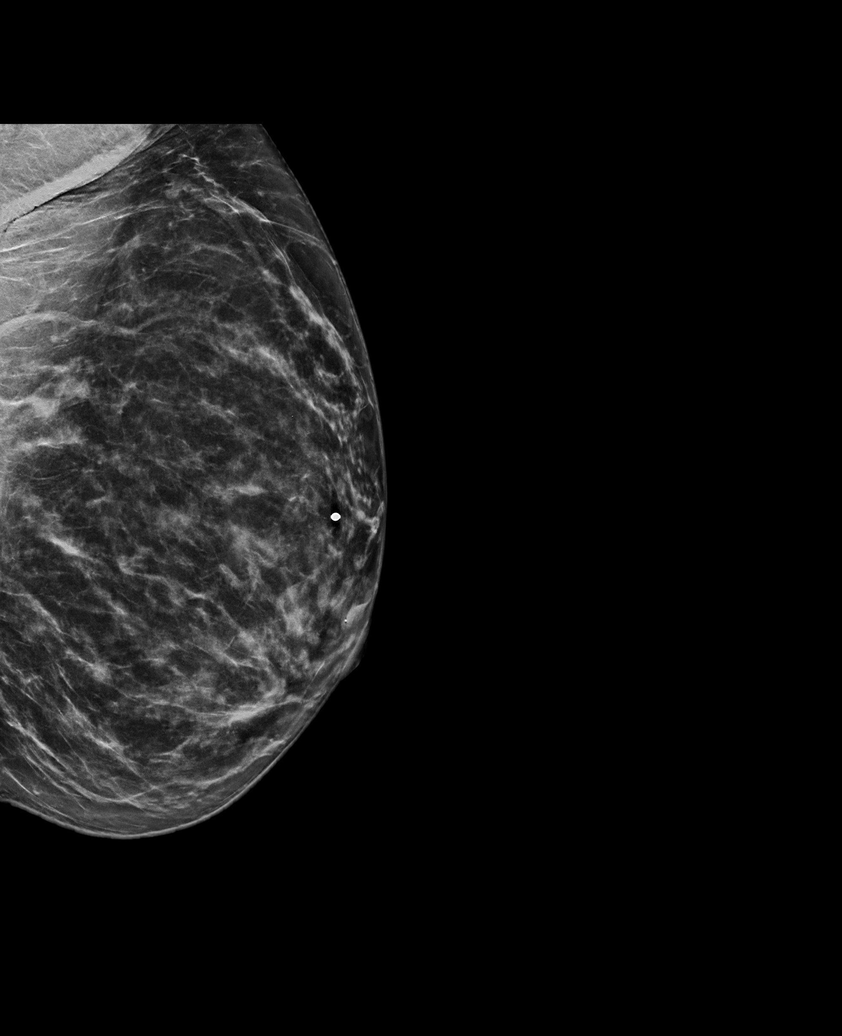

[R MLO synth-2D (2 of 2)]
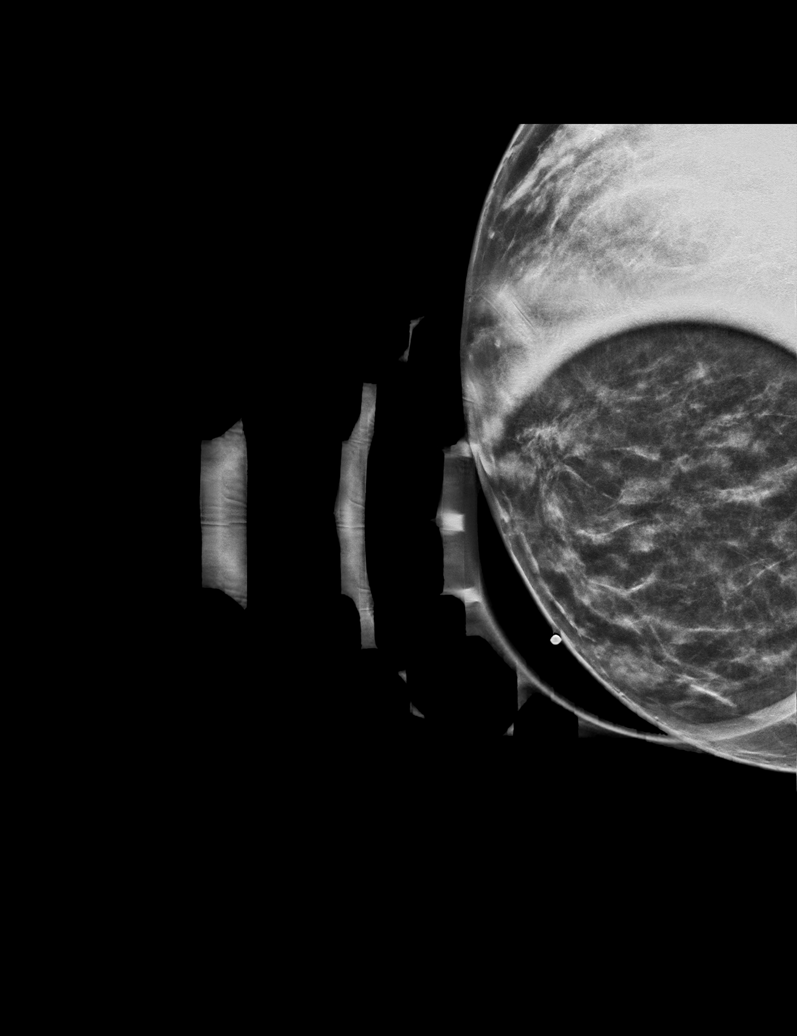

[R CC synth-2D]
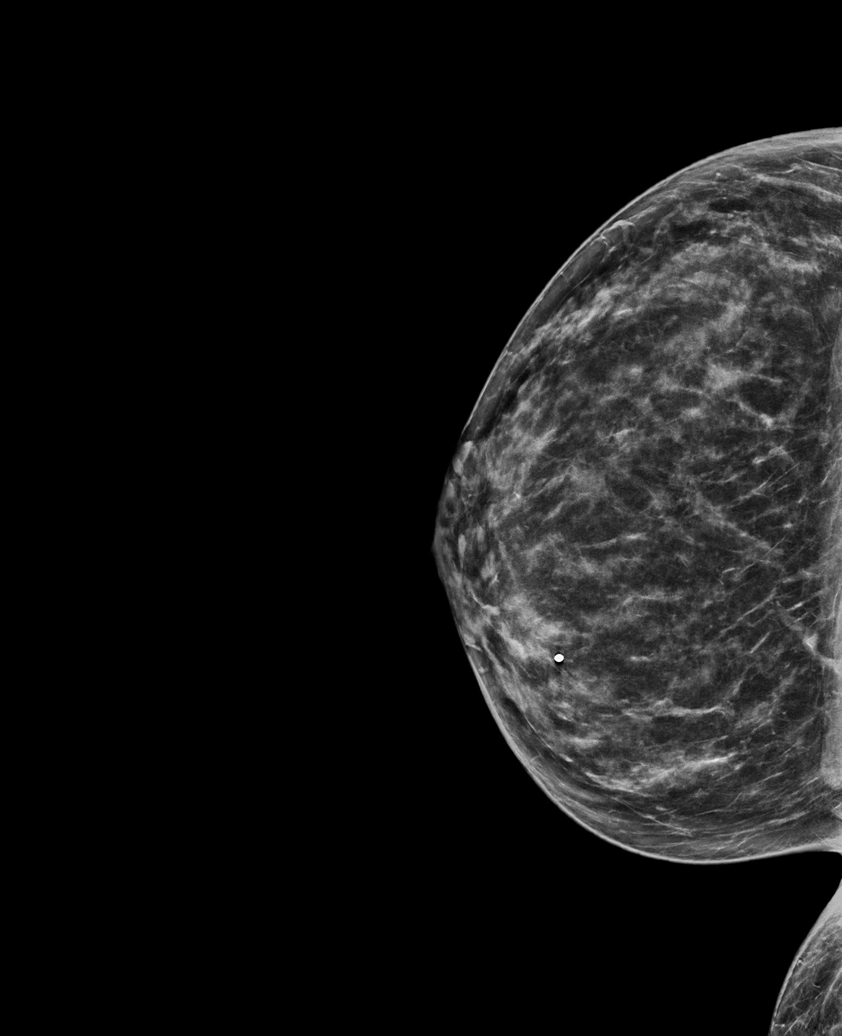

[6 of 36 positions shown; findings below may reference images not displayed]

ACR Breast Density Category c: The breast tissue is heterogeneously
dense, which may obscure small masses.
FINDINGS: Metallic skin markers were placed by the patient in the regions of
palpable concern. In the right breast a skin marker was placed in
the lower inner quadrant. A spot tangential view of this region of
the palpable lump shows a circumscribed oval mass in the superficial
aspect of the breast parenchyma. No architectural distortion,
suspicious microcalcification, or additional mass identified in the
right breast.

In the region of palpable concern on the left is a circumscribed
low-density mass measuring approximately 1.5 cm. The remainder of
the left breast is negative.

Mammographic images were processed with CAD.

On physical exam, I palpate a subtle 1 cm mass in the 4 o'clock
position of the right breast 3 cm from the nipple. In the 3 o'clock
position of the left breast is a soft smooth palpable lump measuring
approximately 1.5 cm.

Targeted ultrasound is performed, showing in the right breast at 4
o'clock position 3 cm from the nipple a 1.2 x 0.6 x 1.1 cm
circumscribed oval parallel mass with gentle lobulations and
internal echogenic bands. This is a probably benign mass, likely a
fibroadenoma.

In the left breast at 3 o'clock position 3 cm from the nipple is a
circumscribed oval mixed echogenicity mass with internal fat density
and hypoechoic areas. This mass measures 1.5 x 0.6 x 1.1 cm and is
favored to be a benign hamartoma.

In the 6 o'clock left breast 2 cm from the nipple is an incidentally
noted circumscribed hypoechoic mass measuring 0.7 x 0.4 x 0.6 cm. A
benign cyst is seen incidentally in the 5 o'clock position of the
left breast.
IMPRESSION: Probably benign mass 4 o'clock position in the right breast, favored
to be a benign fibroadenoma.

Probably benign masses in the left breast, including a probable
hamartoma at 3 o'clock position and probable fibroadenoma at 6
o'clock position.

RECOMMENDATION:
Bilateral breast ultrasound is recommended in 6 months to assess the
probably benign masses.

I have discussed the findings and recommendations with the patient.
Results were also provided in writing at the conclusion of the
visit. If applicable, a reminder letter will be sent to the patient
regarding the next appointment.

BI-RADS CATEGORY  3: Probably benign.

## 2020-11-23 IMAGING — US ULTRASOUND RIGHT BREAST LIMITED
1 series · 7 of 7 positions shown · non-contrast
Comparison: Previous exam(s).

CLINICAL DATA: 48-year-old patient with possible mass palpated on
clinical physical exam in the right breast, 6 o'clock region and in
the left breast 3 o'clock region.

EXAM:
DIGITAL DIAGNOSTIC BILATERAL MAMMOGRAM WITH CAD AND TOMO
ULTRASOUND BILATERAL BREAST

[Series 1: ultrasound right breast limited · 0.06mm/px · 7 of 7 slices shown]
[im 1/7]
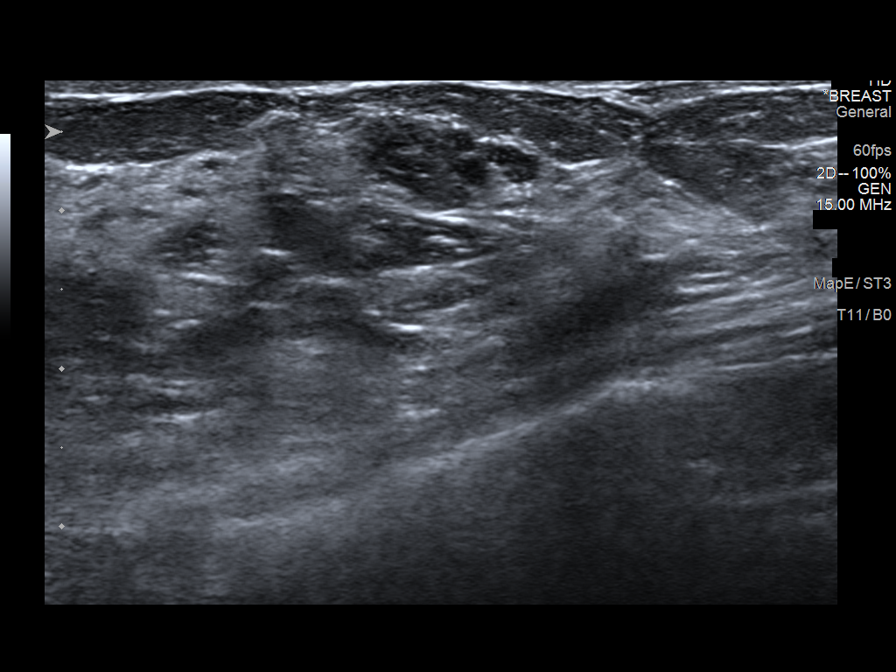
[im 2/7]
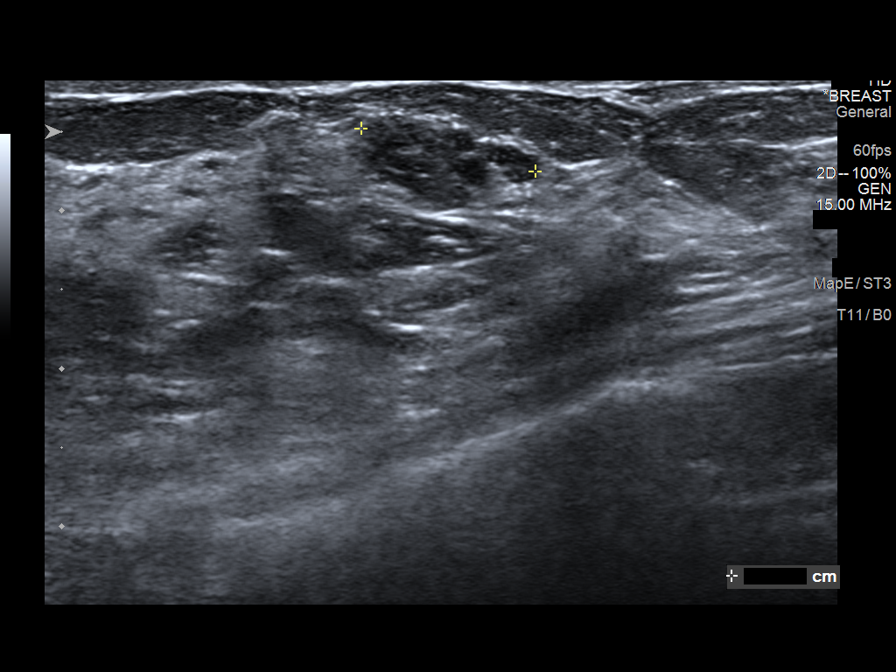
[im 3/7]
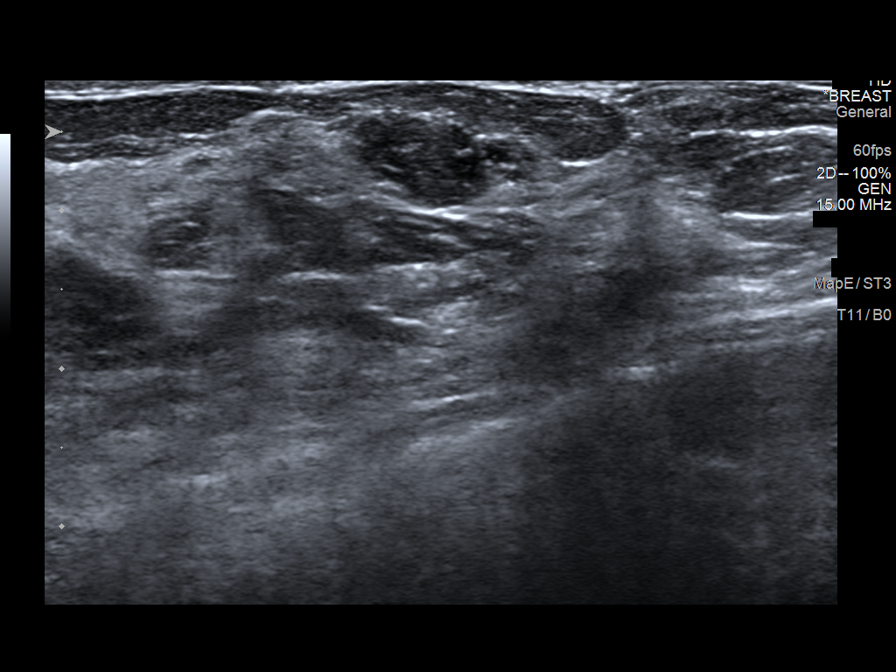
[im 4/7]
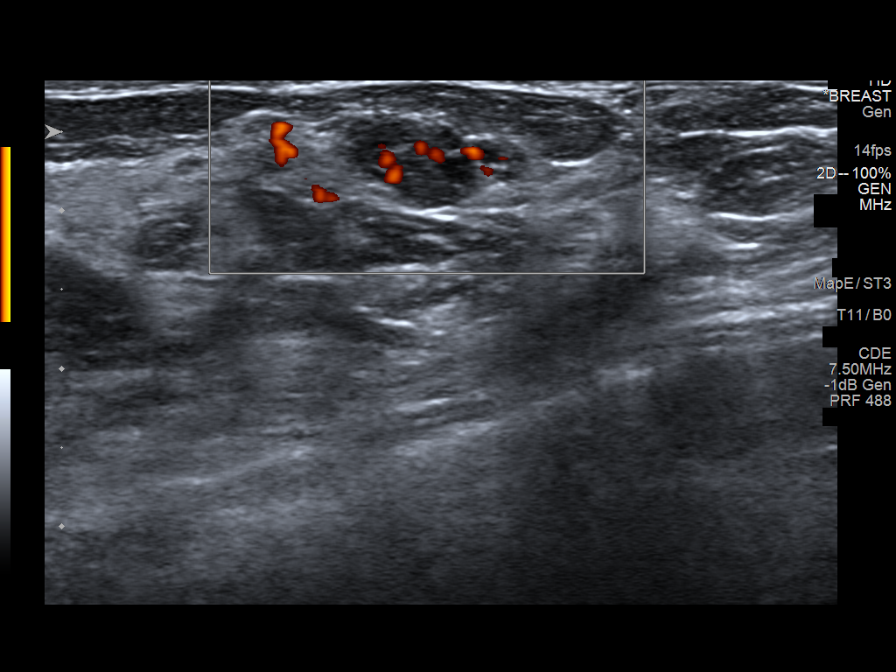
[im 5/7]
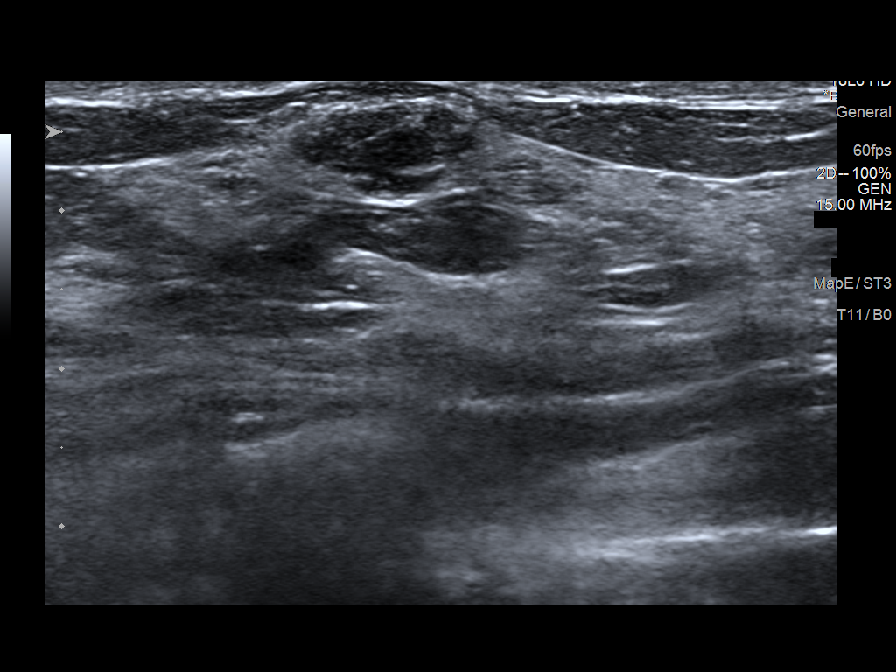
[im 6/7]
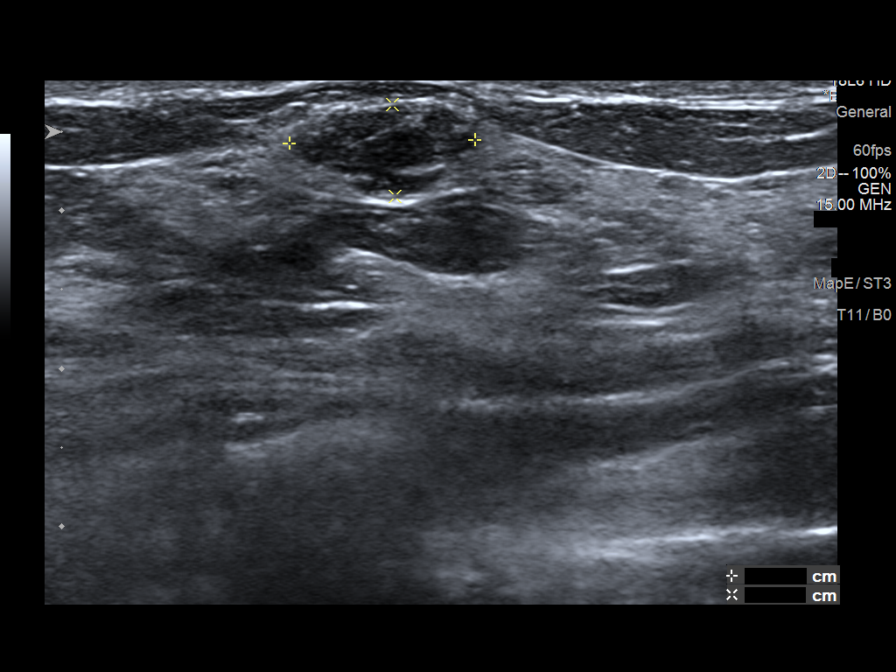
[im 7/7]
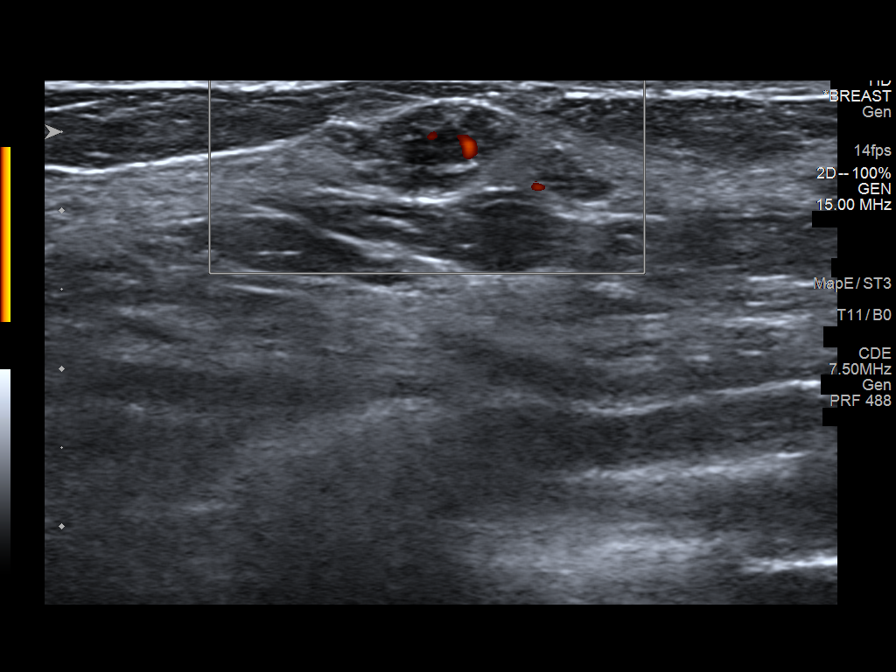

[7 of 7 positions shown; findings below may reference images not displayed]

ACR Breast Density Category c: The breast tissue is heterogeneously
dense, which may obscure small masses.
FINDINGS: Metallic skin markers were placed by the patient in the regions of
palpable concern. In the right breast a skin marker was placed in
the lower inner quadrant. A spot tangential view of this region of
the palpable lump shows a circumscribed oval mass in the superficial
aspect of the breast parenchyma. No architectural distortion,
suspicious microcalcification, or additional mass identified in the
right breast.

In the region of palpable concern on the left is a circumscribed
low-density mass measuring approximately 1.5 cm. The remainder of
the left breast is negative.

Mammographic images were processed with CAD.

On physical exam, I palpate a subtle 1 cm mass in the 4 o'clock
position of the right breast 3 cm from the nipple. In the 3 o'clock
position of the left breast is a soft smooth palpable lump measuring
approximately 1.5 cm.

Targeted ultrasound is performed, showing in the right breast at 4
o'clock position 3 cm from the nipple a 1.2 x 0.6 x 1.1 cm
circumscribed oval parallel mass with gentle lobulations and
internal echogenic bands. This is a probably benign mass, likely a
fibroadenoma.

In the left breast at 3 o'clock position 3 cm from the nipple is a
circumscribed oval mixed echogenicity mass with internal fat density
and hypoechoic areas. This mass measures 1.5 x 0.6 x 1.1 cm and is
favored to be a benign hamartoma.

In the 6 o'clock left breast 2 cm from the nipple is an incidentally
noted circumscribed hypoechoic mass measuring 0.7 x 0.4 x 0.6 cm. A
benign cyst is seen incidentally in the 5 o'clock position of the
left breast.
IMPRESSION: Probably benign mass 4 o'clock position in the right breast, favored
to be a benign fibroadenoma.

Probably benign masses in the left breast, including a probable
hamartoma at 3 o'clock position and probable fibroadenoma at 6
o'clock position.

RECOMMENDATION:
Bilateral breast ultrasound is recommended in 6 months to assess the
probably benign masses.

I have discussed the findings and recommendations with the patient.
Results were also provided in writing at the conclusion of the
visit. If applicable, a reminder letter will be sent to the patient
regarding the next appointment.

BI-RADS CATEGORY  3: Probably benign.

## 2021-05-26 IMAGING — US ULTRASOUND RIGHT BREAST LIMITED
1 series · 4 of 4 positions shown · non-contrast
Comparison: Previous exam(s).

CLINICAL DATA: Six-month follow-up for probably benign masses in
the breasts bilaterally.

EXAM:
ULTRASOUND OF THE BILATERAL BREAST

[Series 1: ultrasound right breast limited · 0.06mm/px · 4 of 4 slices shown]
[im 1/4]
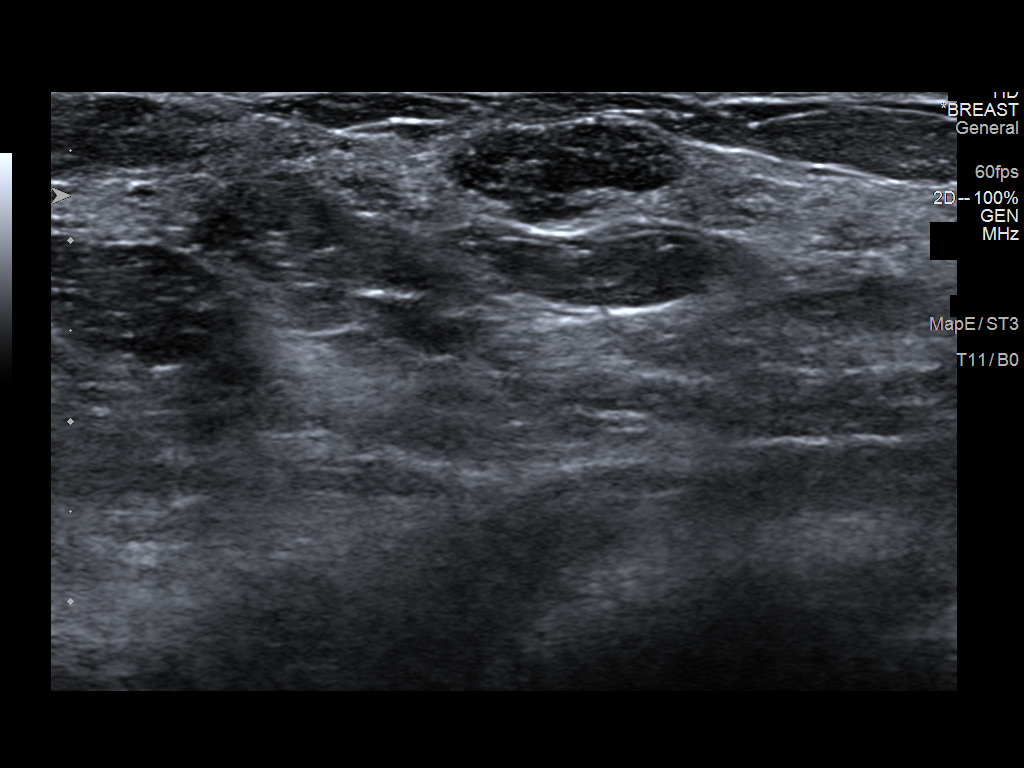
[im 2/4]
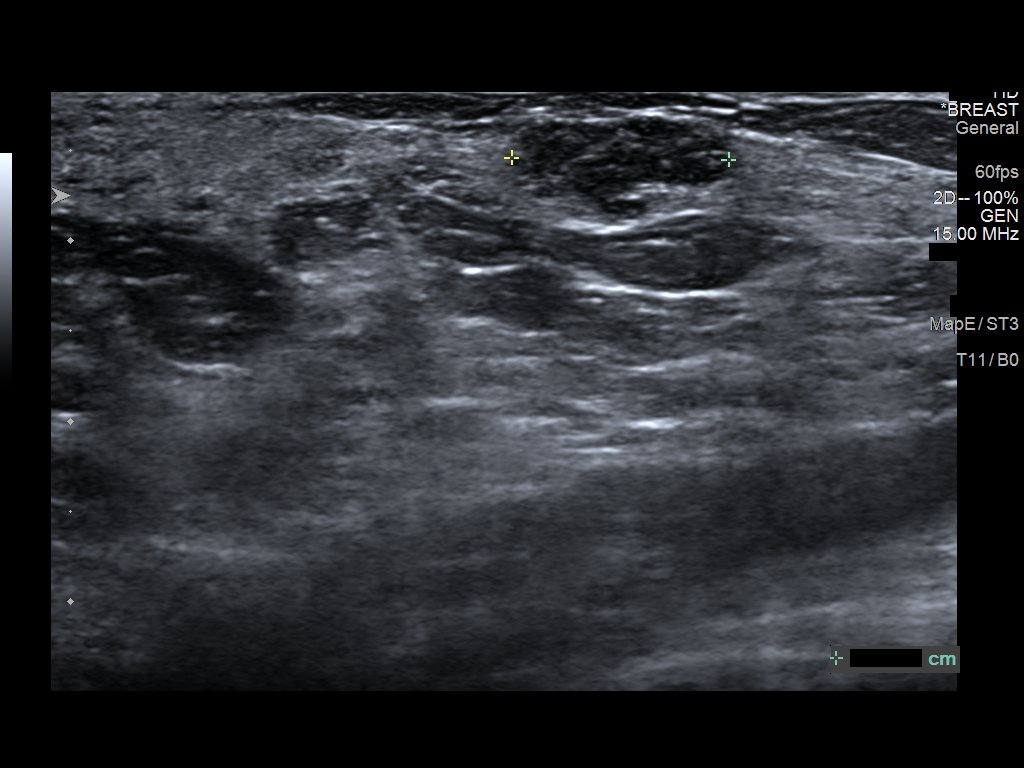
[im 3/4]
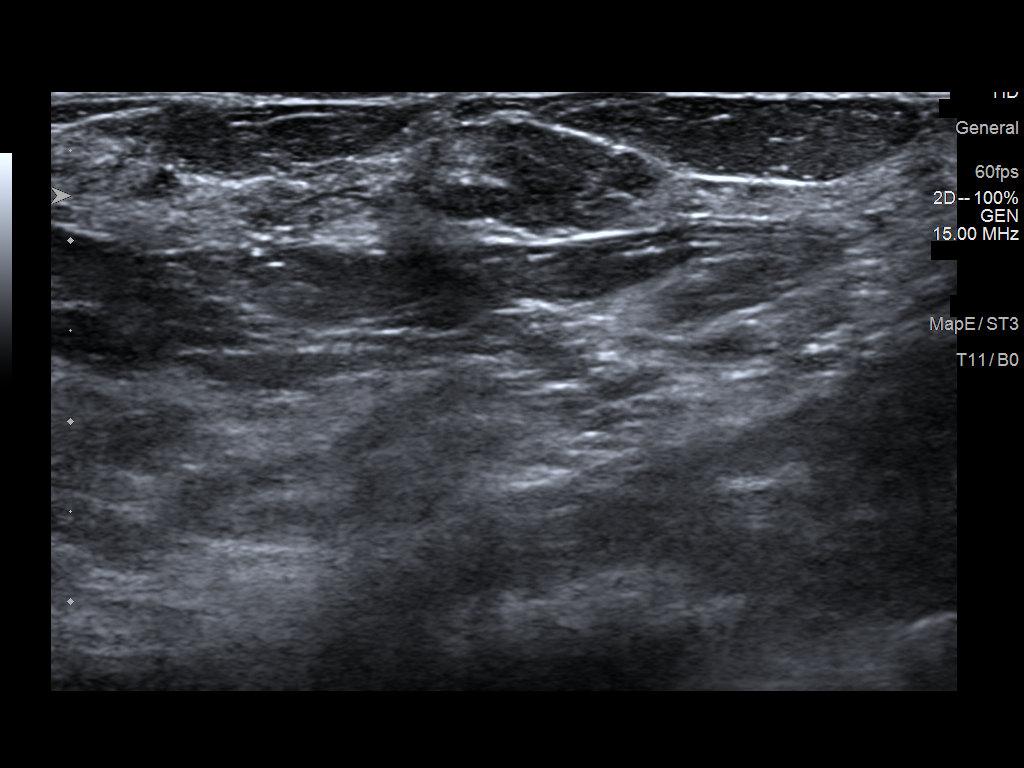
[im 4/4]
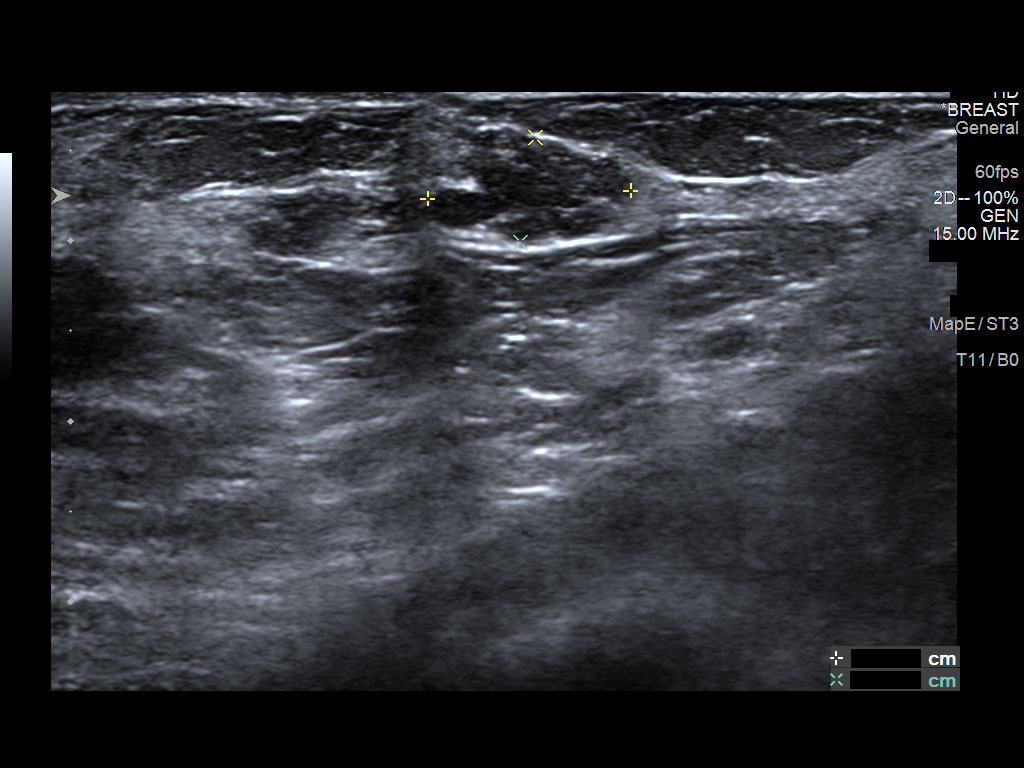

[4 of 4 positions shown; findings below may reference images not displayed]

FINDINGS: Ultrasound of the right breast at 4 o'clock, 3 cm from the nipple
demonstrates a stable oval hypoechoic mass measuring 1.2 x 0.6 x
1.1, previously measuring 1.2 x 0.6 x 1.1 cm.

Ultrasound of the left breast at 3 o'clock, 3 cm from the nipple
demonstrates a heterogeneous oval mass measuring 1.6 x 0.7 x 1.4 cm.
Previously, the mass was measured at 1.5 x 0.6 x 1.1 cm, however,
when correlated with the mammogram images today's measurement is
likely more accurate, as it measured about 1.7 cm mammographically.

The left breast mass at 6 o'clock, 2 cm from the nipple measures
x 0.5 x 0.5 cm, previously 0.7 x 0.4 x 0.6 cm.

The left breast mass at 5 o'clock, 3 cm from the nipple measures
x 0.3 x 0.9 cm, previously 1.0 x 0.2 x 0.9 cm.
IMPRESSION: The 1 right and 3 left probably benign breast masses are stable.

RECOMMENDATION:
Bilateral diagnostic mammogram and bilateral ultrasound in [REDACTED]

I have discussed the findings and recommendations with the patient.
Results were also provided in writing at the conclusion of the
visit. If applicable, a reminder letter will be sent to the patient
regarding the next appointment.

BI-RADS CATEGORY  3: Probably benign.

## 2021-05-26 IMAGING — US ULTRASOUND LEFT BREAST LIMITED
1 series · 13 of 16 positions shown · non-contrast
Comparison: Previous exam(s).

CLINICAL DATA: Six-month follow-up for probably benign masses in
the breasts bilaterally.

EXAM:
ULTRASOUND OF THE BILATERAL BREAST

[Series 1: ultrasound left breast limited · 0.06mm/px · 13 of 16 slices shown]
[im 1/16]
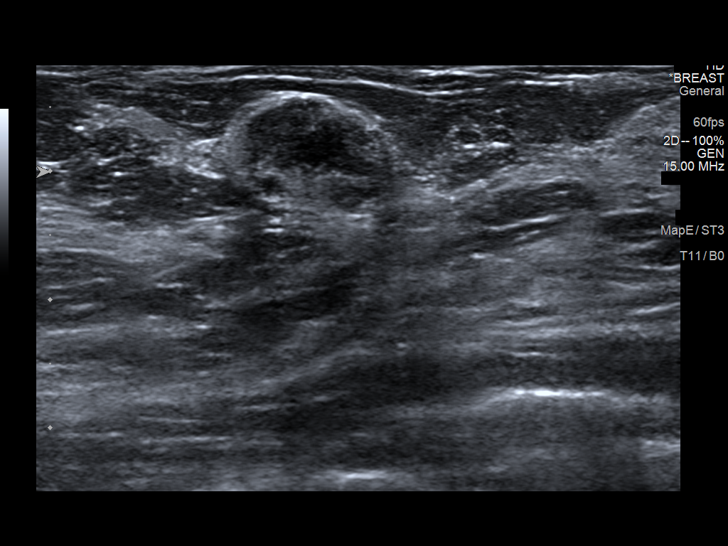
[im 2/16]
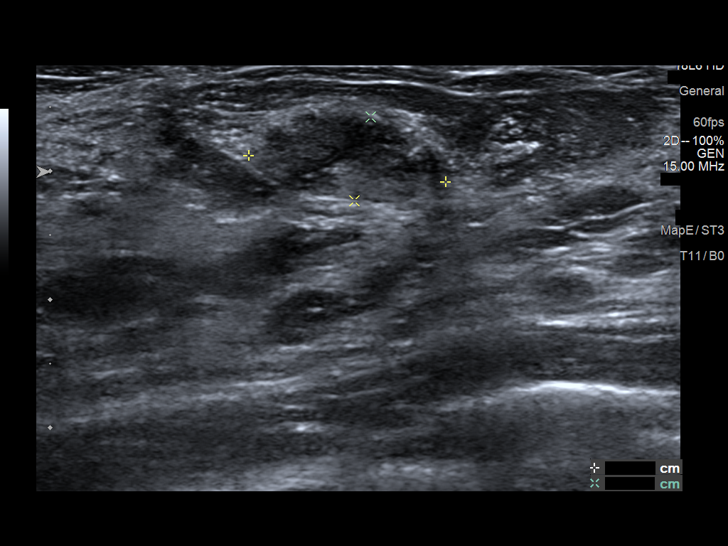
[im 4/16]
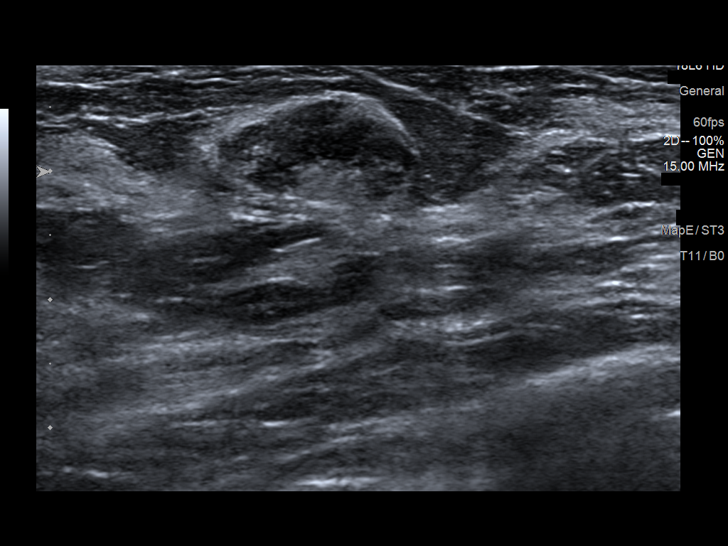
[im 5/16]
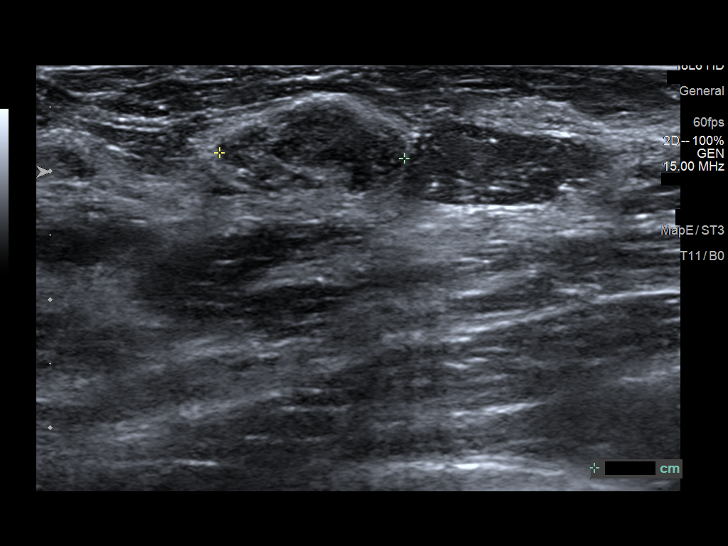
[im 6/16]
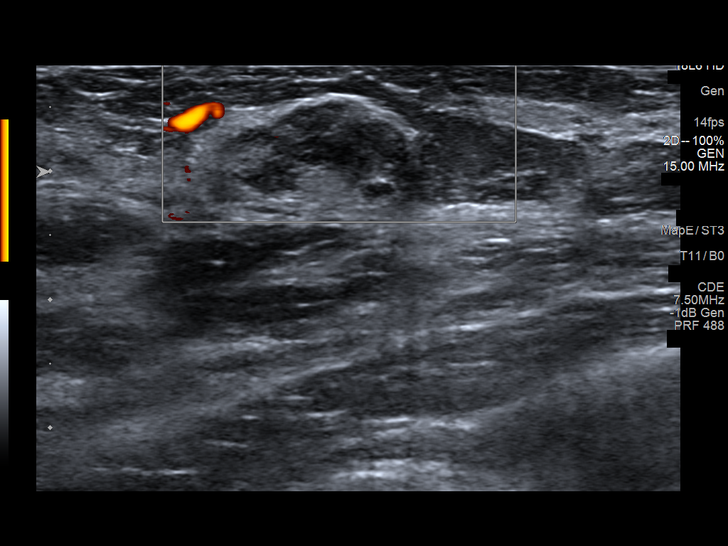
[im 7/16]
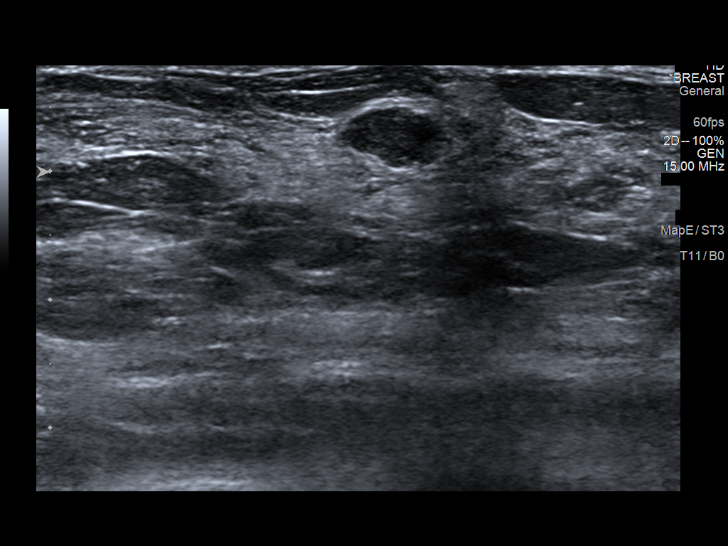
[im 9/16]
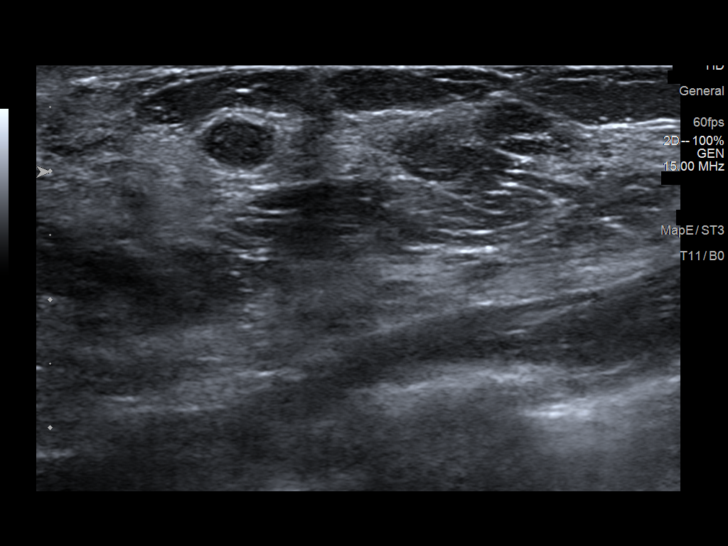
[im 10/16]
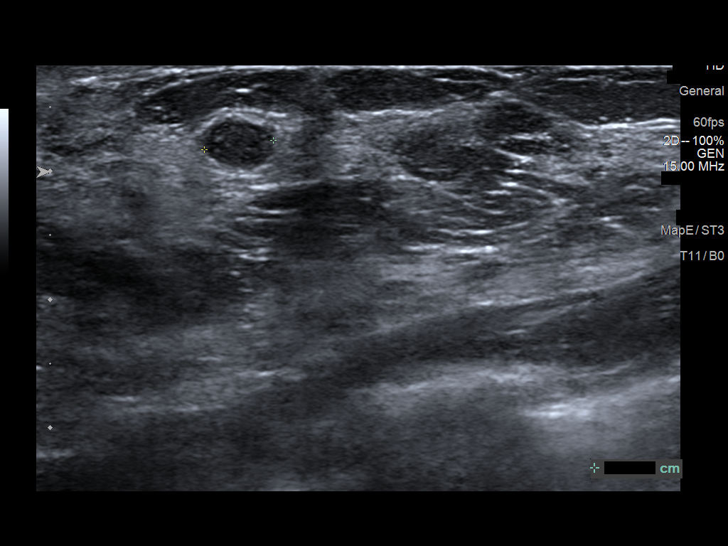
[im 11/16]
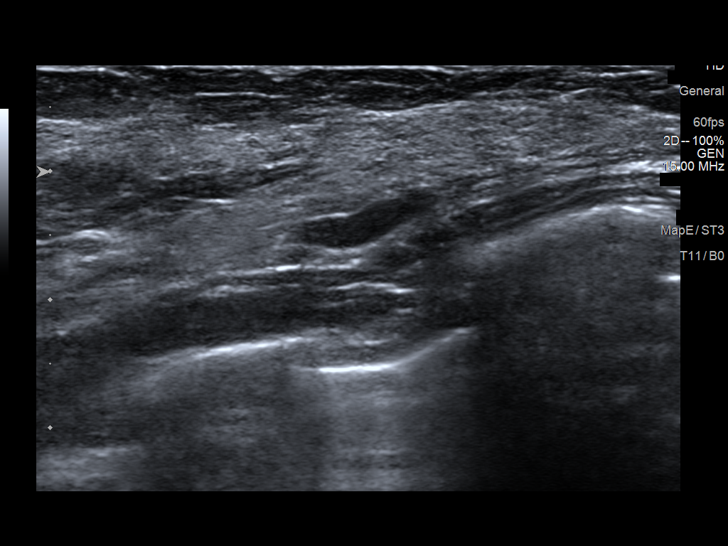
[im 12/16]
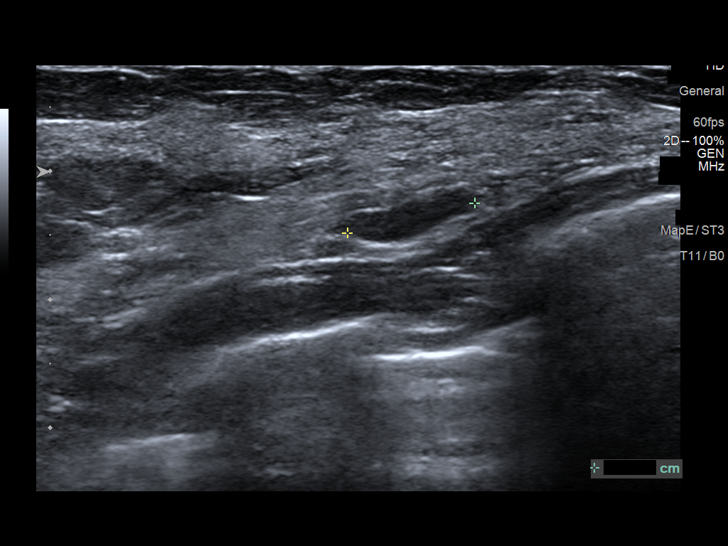
[im 13/16]
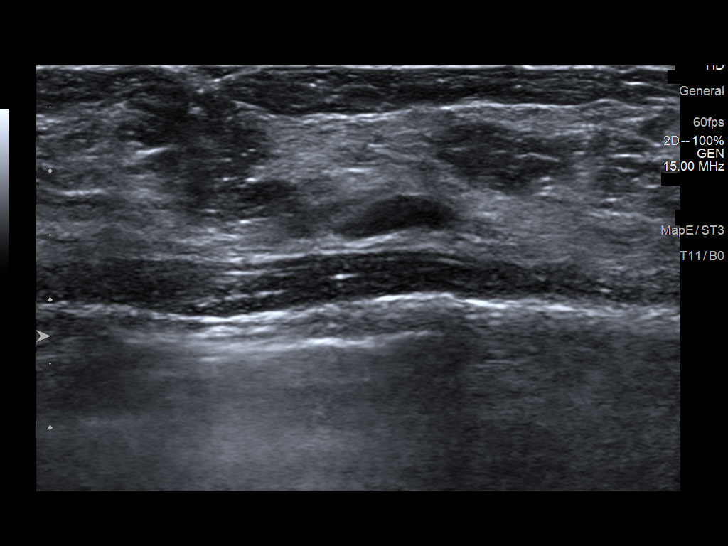
[im 15/16]
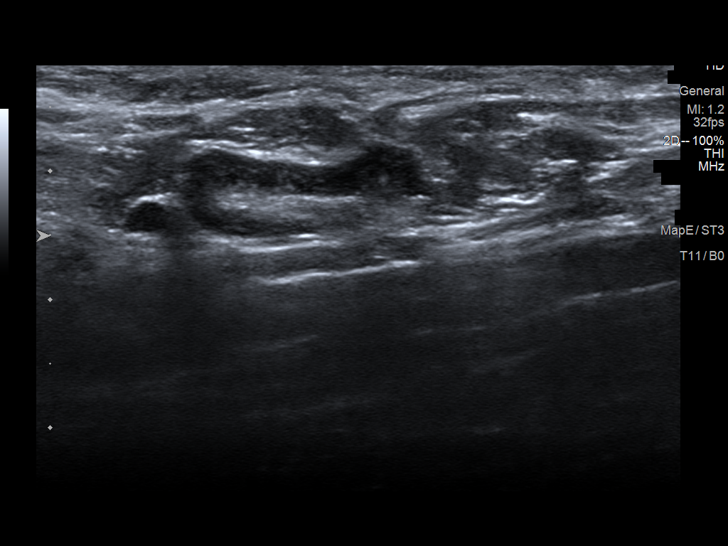
[im 16/16]
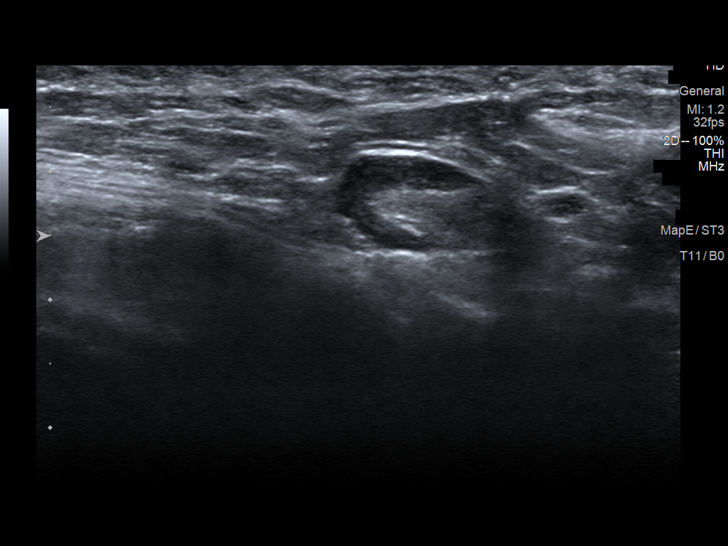

[13 of 16 positions shown; findings below may reference images not displayed]

FINDINGS: Ultrasound of the right breast at 4 o'clock, 3 cm from the nipple
demonstrates a stable oval hypoechoic mass measuring 1.2 x 0.6 x
1.1, previously measuring 1.2 x 0.6 x 1.1 cm.

Ultrasound of the left breast at 3 o'clock, 3 cm from the nipple
demonstrates a heterogeneous oval mass measuring 1.6 x 0.7 x 1.4 cm.
Previously, the mass was measured at 1.5 x 0.6 x 1.1 cm, however,
when correlated with the mammogram images today's measurement is
likely more accurate, as it measured about 1.7 cm mammographically.

The left breast mass at 6 o'clock, 2 cm from the nipple measures
x 0.5 x 0.5 cm, previously 0.7 x 0.4 x 0.6 cm.

The left breast mass at 5 o'clock, 3 cm from the nipple measures
x 0.3 x 0.9 cm, previously 1.0 x 0.2 x 0.9 cm.
IMPRESSION: The 1 right and 3 left probably benign breast masses are stable.

RECOMMENDATION:
Bilateral diagnostic mammogram and bilateral ultrasound in [REDACTED]

I have discussed the findings and recommendations with the patient.
Results were also provided in writing at the conclusion of the
visit. If applicable, a reminder letter will be sent to the patient
regarding the next appointment.

BI-RADS CATEGORY  3: Probably benign.

## 2021-10-05 ENCOUNTER — Other Ambulatory Visit: Payer: Self-pay | Admitting: Obstetrics and Gynecology

## 2021-10-05 DIAGNOSIS — Z09 Encounter for follow-up examination after completed treatment for conditions other than malignant neoplasm: Secondary | ICD-10-CM

## 2021-11-09 ENCOUNTER — Other Ambulatory Visit: Payer: Self-pay | Admitting: Obstetrics and Gynecology

## 2021-11-09 DIAGNOSIS — N6310 Unspecified lump in the right breast, unspecified quadrant: Secondary | ICD-10-CM

## 2021-11-09 DIAGNOSIS — N63 Unspecified lump in unspecified breast: Secondary | ICD-10-CM

## 2021-11-09 DIAGNOSIS — N6320 Unspecified lump in the left breast, unspecified quadrant: Secondary | ICD-10-CM

## 2021-11-12 ENCOUNTER — Other Ambulatory Visit: Payer: BC Managed Care – PPO

## 2023-04-29 ENCOUNTER — Ambulatory Visit: Payer: Self-pay | Admitting: General Surgery

## 2023-04-29 NOTE — H&P (Signed)
REFERRING PHYSICIAN:  Jorge Ny, NP  PROVIDER:  Elenora Gamma, MD  MRN: [id number] DOB: 07-01-1971 DATE OF ENCOUNTER: 04/29/2023  Subjective   Chief Complaint: New Consultation     History of Present Illness: Virginia Hull is a 52 y.o. female who is seen today as an office consultation at the request of Dr. Madilyn Fireman for evaluation of New Consultation .   Patient presents to the office due to chronically irritated anal skin tags.  She states that these have been present for years.  She had trouble with anemia due to fibroids earlier in life and needed to take iron on a regular basis, which causes constipation and hemorrhoid issues.  She is not having any of those issues now and has regular bowel movements with minimal straining and soft stool.  She complains of irritation with wiping and difficulty with hygiene due to her skin tags.   Review of Systems: A complete review of systems was obtained from the patient.  I have reviewed this information and discussed as appropriate with the patient.  See HPI as well for other ROS.    Medical History: Past Medical History:  Diagnosis Date   Depression     There is no problem list on file for this patient.   Past Surgical History:  Procedure Laterality Date   Uterine artery embolization  2004   ACHILLES TENDON REPAIR Right 2007   LAPAROSCOPIC OVARIAN CYSTECTOMY Right 2018   CYSTOSCOPY  2018   Laparoscopic lysis of adhesions  2018   HYSTERECTOMY     LAPAROSCOPY W/ TOTAL BILATERAL PELVIC AND PERI-AORTIC LYMPHADENECTOMY       Allergies  Allergen Reactions   Escitalopram Other (See Comments)    Current Outpatient Medications on File Prior to Visit  Medication Sig Dispense Refill   meloxicam (MOBIC) 7.5 MG tablet TAKE 1 TABLET BY MOUTH 2 TIMES DAILY FOR TWO WEEKS, THEN AS NEEDED     buPROPion (WELLBUTRIN XL) 300 MG XL tablet Take 300 mg by mouth once daily     cholecalciferol (VITAMIN D3) 1000 unit tablet Take 1,000  Units by mouth once daily     OMEGA-3 FATTY ACIDS ORAL 1 gelcap Orally once a day     No current facility-administered medications on file prior to visit.    History reviewed. No pertinent family history.   Social History   Tobacco Use  Smoking Status Never  Smokeless Tobacco Never     Social History   Socioeconomic History   Marital status: Single  Tobacco Use   Smoking status: Never   Smokeless tobacco: Never  Substance and Sexual Activity   Alcohol use: Never   Drug use: Never   Social Determinants of Health   Food Insecurity: No Food Insecurity (12/16/2021)   Received from [facility]   Hunger Vital Sign    Worried About Running Out of Food in the Last Year: Never true    Ran Out of Food in the Last Year: Never true   Received from [employer]   Social Network    Objective:    Vitals:   04/29/23 1535 04/29/23 1541  BP: 139/83   Pulse: 58   Temp: 36.7 C (98 F)   SpO2: 98%   Weight: 82 kg (180 lb 12.8 oz)   Height: 154.9 cm (5\' 1" )   PainSc:    2  PainLoc:  Rectum     Exam Gen: NAD Abd: soft Rectal: Large, elongated, chronically flamed anterior and posterior anal  skin tags   Labs, Imaging and Diagnostic Testing:   Assessment and Plan:  Diagnoses and all orders for this visit:  Skin tags, anus or rectum     52 year old female with enlarged chronically inflamed skin tags.  I have recommended excision.  We have discussed that this can be relatively painful for several weeks afterwards as it heals.  There is also a risk of bleeding and recurrence.  All questions were answered.  Patient would like to proceed as soon as possible.  Vanita Panda, MD Colon and Rectal Surgery [facility]

## 2024-09-04 ENCOUNTER — Other Ambulatory Visit: Payer: Self-pay | Admitting: Family Medicine

## 2024-09-04 DIAGNOSIS — N63 Unspecified lump in unspecified breast: Secondary | ICD-10-CM

## 2024-09-11 ENCOUNTER — Other Ambulatory Visit: Payer: Self-pay | Admitting: Family Medicine

## 2024-09-11 DIAGNOSIS — Z1231 Encounter for screening mammogram for malignant neoplasm of breast: Secondary | ICD-10-CM

## 2024-09-28 ENCOUNTER — Ambulatory Visit
Admission: RE | Admit: 2024-09-28 | Discharge: 2024-09-28 | Disposition: A | Discharge status: Home / Self Care | Source: Ambulatory Visit | Attending: Family Medicine | Admitting: Family Medicine

## 2024-09-28 DIAGNOSIS — Z1231 Encounter for screening mammogram for malignant neoplasm of breast: Secondary | ICD-10-CM
